# Patient Record
Sex: Male | Born: 1955 | Race: White | Hispanic: No | Marital: Single | Smoking: Current some day smoker
Health system: Southern US, Community
[De-identification: ages and names within clinical notes are randomized; demographics above are authoritative.]

## PROBLEM LIST (undated history)

## (undated) DIAGNOSIS — I2699 Other pulmonary embolism without acute cor pulmonale: Secondary | ICD-10-CM

## (undated) DIAGNOSIS — I8290 Acute embolism and thrombosis of unspecified vein: Secondary | ICD-10-CM

## (undated) HISTORY — DX: Acute embolism and thrombosis of unspecified vein: I82.90

## (undated) HISTORY — PX: FRACTURE SURGERY: SHX138

## (undated) HISTORY — DX: Other pulmonary embolism without acute cor pulmonale: I26.99

## (undated) HISTORY — PX: OTHER SURGICAL HISTORY: SHX169

## (undated) HISTORY — PX: TONSILLECTOMY: SUR1361

---

## 2006-01-14 ENCOUNTER — Other Ambulatory Visit: Payer: Self-pay

## 2006-01-14 ENCOUNTER — Emergency Department: Payer: Self-pay | Admitting: Emergency Medicine

## 2014-01-01 DIAGNOSIS — Z720 Tobacco use: Secondary | ICD-10-CM | POA: Insufficient documentation

## 2014-01-09 DIAGNOSIS — Z9889 Other specified postprocedural states: Secondary | ICD-10-CM | POA: Insufficient documentation

## 2014-01-09 DIAGNOSIS — Z789 Other specified health status: Secondary | ICD-10-CM | POA: Insufficient documentation

## 2014-01-09 DIAGNOSIS — Z7289 Other problems related to lifestyle: Secondary | ICD-10-CM | POA: Insufficient documentation

## 2014-01-09 DIAGNOSIS — I7410 Embolism and thrombosis of unspecified parts of aorta: Secondary | ICD-10-CM | POA: Insufficient documentation

## 2014-06-11 ENCOUNTER — Emergency Department: Payer: Self-pay | Admitting: Emergency Medicine

## 2014-12-13 ENCOUNTER — Ambulatory Visit (INDEPENDENT_AMBULATORY_CARE_PROVIDER_SITE_OTHER): Payer: BLUE CROSS/BLUE SHIELD | Admitting: Family Medicine

## 2014-12-13 ENCOUNTER — Other Ambulatory Visit: Payer: Self-pay | Admitting: Family Medicine

## 2014-12-13 ENCOUNTER — Telehealth: Payer: Self-pay

## 2014-12-13 ENCOUNTER — Encounter: Payer: Self-pay | Admitting: Family Medicine

## 2014-12-13 VITALS — BP 130/88 | HR 92 | Temp 98.2°F | Ht 72.7 in | Wt 122.0 lb

## 2014-12-13 DIAGNOSIS — K625 Hemorrhage of anus and rectum: Secondary | ICD-10-CM

## 2014-12-13 DIAGNOSIS — I2699 Other pulmonary embolism without acute cor pulmonale: Secondary | ICD-10-CM | POA: Diagnosis not present

## 2014-12-13 DIAGNOSIS — I82409 Acute embolism and thrombosis of unspecified deep veins of unspecified lower extremity: Secondary | ICD-10-CM

## 2014-12-13 DIAGNOSIS — L853 Xerosis cutis: Secondary | ICD-10-CM

## 2014-12-13 DIAGNOSIS — L850 Acquired ichthyosis: Secondary | ICD-10-CM

## 2014-12-13 MED ORDER — RIVAROXABAN 20 MG PO TABS: 20.0000 mg | ORAL_TABLET | Freq: Every day | ORAL | Status: DC

## 2014-12-13 NOTE — Telephone Encounter (Signed)
Patient referred to [facility] for a colonoscopy for rectal bleeding.  He is scheduled to be seen 12/18/2014 at 11am.  Patient given appointment information in the office.

## 2014-12-13 NOTE — Progress Notes (Signed)
Date of Service: 12/13/2014  Date of Birth May 14, 1956  Patient Name: Steve Chaney Provider: Olevia PerchesMegan Johnson, DO   Vital Signs: Blood pressure 130/88, pulse 92, temperature 98.2 F (36.8 C), height 6' 0.7" (1.847 m), weight 122 lb (55.339 kg), SpO2 97 %. BMI: Body mass index is 16.22 kg/(m^2).  Subjective:    Continues to be caring for his elderly aunt. Doing well. Presents today for follow up on a couple of things.   ABDOMINAL ISSUES- BRBPR in February when he first came in. Had had colonoscopy the year previously that was normal. He thinks that this was due to his hemorrhoids, and now he is doing better and has not had any issues since then.  Duration: 3 days, has not had had any problems since his last visit.  Constipation: intermittent Diarrhea: no Episodes of diarrhea/day: Mucous in the stool: no Heartburn: no Bloating:no Flatulence: no Nausea: no Vomiting: no Melena or hematochezia: no Rash: no Jaundice: no Fever: no Weight loss: no  He also notes that his fingers continue to be very sore and cracked and painful. These were pulled by a fishing line several years ago and they had to be debrided. They were very concerned about infection from the ocean, but it seems to have healed. However, now he notes that he has had a lot of pain and that the skin keeps cracking. He would like to see what he can do to try to get the skin to heal up.   He also needs a refill of his xarelto. He has been on it for about 18 months. 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 Notes have been difficult to track down on this occurrence, and it is not clear if he had just DVTs or arterial clots. He has been on xarelto ever since and has done well with it except for 1 episode of rectal bleeding, which has since resolved. He would like to come off of it if possible, but doesn't know if it is or not.   Review of Systems - Negative except what is listed above.    General ROS: negative Psychological ROS: negative Hematological and Lymphatic ROS: negative Musculoskeletal ROS: negative Dermatological ROS: negative    The patient does not have a history of falls. I did not complete a risk assessment for falls. A plan of care for falls was not documented.  Social History:  History   Social History  . Marital Status: Single    Spouse Name: N/A  . Number of Children: N/A  . Years of Education: N/A   Occupational History  . Not on file.   Social History Main Topics  . Smoking status: Current Every Day Smoker -- 0.50 packs/day    Types: Cigarettes  . Smokeless tobacco: Never Used  . Alcohol Use: 1.2 oz/week    2 Cans of beer per week     Comment: Drinks socially  . Drug Use: No  . Sexual Activity: Not on file   Other Topics Concern  . Not on file   Social History Narrative   History  Smoking status  . Current Every Day Smoker -- 0.50 packs/day  . Types: Cigarettes  Smokeless tobacco  . Never Used   History  Alcohol Use  . 1.2 oz/week  . 2 Cans of beer per week    Comment: Drinks socially    Family History  Problem Relation Age of Onset  . Hyperlipidemia Mother   .  Hyperlipidemia Father   . Heart disease Father    Past Medical History  Diagnosis Date  . Blood clot in vein    Past Surgical History  Procedure Laterality Date  . Tonsillectomy    . Blood clot       Current Outpatient Prescriptions on File Prior to Visit  Medication Sig Dispense Refill  . rivaroxaban (XARELTO) 20 MG TABS tablet Take 20 mg by mouth daily with supper.     No current facility-administered medications on file prior to visit.    No Known Allergies   Objective:  Physical Exam  Constitutional: He is oriented to person, place, and time. He appears well-developed and well-nourished.  HENT:  Head: Normocephalic and atraumatic.  Eyes: EOM are normal. Pupils are equal, round, and reactive to light.  Neck: Normal range of motion.   Cardiovascular: Normal rate, regular rhythm and normal heart sounds.   Pulmonary/Chest: Effort normal and breath sounds normal.  Neurological: He is alert and oriented to person, place, and time.  Skin: Skin is warm and dry.  Cracked dry skin at the tips of the index and middle fingers on the L  Psychiatric: He has a normal mood and affect. Judgment and thought content normal.     Assessment/Plan: 1. DVT (deep venous thrombosis), unspecified laterality Unclear if DVT or arterial clots, records from this hospitalization were requested, but have not been received. Patient would like to come off his xarelto, but unclear if he should. Referral to hematology made today for evaluation and determination regarding length of treatment. Will continue xarelto until that time.  - Ambulatory referral to Hematology  2. Pulmonary embolism Unclear if DVT or arterial clots, records from this hospitalization were requested, but have not been received. Patient would like to come off his xarelto, but unclear if he should. Referral to hematology made today for evaluation and determination regarding length of treatment. Will continue xarelto until that time.  - Ambulatory referral to Hematology  3. Dry skin dermatitis Does not appear to be infected, seems to be due to very dry skin. Information regarding hand dermatitis given to patient today as discussed below. Patient continues to have pain and has been having problems with this for >2 years. Will refer to dermatology for further evaluation. Call with any other concerns.  - Ambulatory referral to Dermatology  4. Bright red blood per rectum Has resolved with no issues. Had colonoscopy done about 18 months ago. Appointment with GI cancelled.Will cal if having further issues.     Follow-up: Return in about 3 months (around 03/15/2015) for PE.

## 2014-12-13 NOTE — Patient Instructions (Signed)
Rivaroxaban oral tablets What is this medicine? RIVAROXABAN (ri va ROX a ban) is an anticoagulant (blood thinner). It is used to treat blood clots in the lungs or in the veins. It is also used after knee or hip surgeries to prevent blood clots. It is also used to lower the chance of stroke in people with a medical condition called atrial fibrillation. This medicine may be used for other purposes; ask your health care provider or pharmacist if you have questions. COMMON BRAND NAME(S): Xarelto, Xarelto Starter Pack What should I tell my health care provider before I take this medicine? They need to know if you have any of these conditions: -bleeding disorders -bleeding in the brain -blood in your stools (black or tarry stools) or if you have blood in your vomit -history of stomach bleeding -kidney disease -liver disease -low blood counts, like low white cell, platelet, or red cell counts -recent or planned spinal or epidural procedure -take medicines that treat or prevent blood clots -an unusual or allergic reaction to rivaroxaban, other medicines, foods, dyes, or preservatives -pregnant or trying to get pregnant -breast-feeding How should I use this medicine? Take this medicine by mouth with a glass of water. Follow the directions on the prescription label. Take your medicine at regular intervals. Do not take it more often than directed. Do not stop taking except on your doctor's advice. Stopping this medicine may increase your risk of a blot clot. Be sure to refill your prescription before you run out of medicine. If you are taking this medicine after hip or knee replacement surgery, take it with or without food. If you are taking this medicine for atrial fibrillation, take it with your evening meal. If you are taking this medicine to treat blood clots, take it with food at the same time each day. If you are unable to swallow your tablet, you may crush the tablet and mix it in applesauce. Then,  immediately eat the applesauce. You should eat more food right after you eat the applesauce containing the crushed tablet. Talk to your pediatrician regarding the use of this medicine in children. Special care may be needed. Overdosage: If you think you have taken too much of this medicine contact a poison control center or emergency room at once. NOTE: This medicine is only for you. Do not share this medicine with others. What if I miss a dose? If you take your medicine once a day and miss a dose, take the missed dose as soon as you remember. If you take your medicine twice a day and miss a dose, take the missed dose immediately. In this instance, 2 tablets may be taken at the same time. The next day you should take 1 tablet twice a day as directed. What may interact with this medicine? -aspirin and aspirin-like medicines -certain antibiotics like erythromycin, azithromycin, and clarithromycin -certain medicines for fungal infections like ketoconazole and itraconazole -certain medicines for irregular heart beat like amiodarone, quinidine, dronedarone -certain medicines for seizures like carbamazepine, phenytoin -certain medicines that treat or prevent blood clots like warfarin, enoxaparin, and dalteparin -conivaptan -diltiazem -felodipine -indinavir -lopinavir; ritonavir -NSAIDS, medicines for pain and inflammation, like ibuprofen or naproxen -ranolazine -rifampin -ritonavir -St. John's wort -verapamil This list may not describe all possible interactions. Give your health care provider a list of all the medicines, herbs, non-prescription drugs, or dietary supplements you use. Also tell them if you smoke, drink alcohol, or use illegal drugs. Some items may interact with your medicine. What  should I watch for while using this medicine? Visit your doctor or health care professional for regular checks on your progress. Your condition will be monitored carefully while you are receiving this  medicine. Notify your doctor or health care professional and seek emergency treatment if you develop breathing problems; changes in vision; chest pain; severe, sudden headache; pain, swelling, warmth in the leg; trouble speaking; sudden numbness or weakness of the face, arm, or leg. These can be signs that your condition has gotten worse. If you are going to have surgery, tell your doctor or health care professional that you are taking this medicine. Tell your health care professional that you use this medicine before you have a spinal or epidural procedure. Sometimes people who take this medicine have bleeding problems around the spine when they have a spinal or epidural procedure. This bleeding is very rare. If you have a spinal or epidural procedure while on this medicine, call your health care professional immediately if you have back pain, numbness or tingling (especially in your legs and feet), muscle weakness, paralysis, or loss of bladder or bowel control. Avoid sports and activities that might cause injury while you are using this medicine. Severe falls or injuries can cause unseen bleeding. Be careful when using sharp tools or knives. Consider using an Neurosurgeonelectric razor. Take special care brushing or flossing your teeth. Report any injuries, bruising, or red spots on the skin to your doctor or health care professional. What side effects may I notice from receiving this medicine? Side effects that you should report to your doctor or health care professional as soon as possible: -allergic reactions like skin rash, itching or hives, swelling of the face, lips, or tongue -back pain -redness, blistering, peeling or loosening of the skin, including inside the mouth -signs and symptoms of bleeding such as bloody or black, tarry stools; red or dark-brown urine; spitting up blood or brown material that looks like coffee grounds; red spots on the skin; unusual bruising or bleeding from the eye, gums, or  nose Side effects that usually do not require medical attention (Report these to your doctor or health care professional if they continue or are bothersome.): -dizziness -muscle pain This list may not describe all possible side effects. Call your doctor for medical advice about side effects. You may report side effects to FDA at 1-800-FDA-1088. Where should I keep my medicine? Keep out of the reach of children. Store at room temperature between 15 and 30 degrees C (59 and 86 degrees F). Throw away any unused medicine after the expiration date. NOTE: This sheet is a summary. It may not cover all possible information. If you have questions about this medicine, talk to your doctor, pharmacist, or health care provider.  2015, Elsevier/Gold Standard. (2013-10-19 18:47:48) Hand Dermatitis Hand dermatitis (dyshidrotic eczema) is a skin condition in which small, itchy, raised dots or fluid-filled blisters form over the palms of the hands. Outbreaks of hand dermatitis can last 3 to 4 weeks. CAUSES  The cause of hand dermatitis is unknown. However, it occurs most often in patients with a history of allergies such as:  Hay fever.  Allergic asthma.  Allergies to latex. Chemical exposure, injuries, and environmental irritants can make hand dermatitis worse. Washing your hands too frequently can remove natural oils, which can dry out the skin and contribute to outbreaks of hand dermatitis. SYMPTOMS  The most common symptom of hand dermatitis is intense itching. Cracks or grooves (fissures) on the fingers can also develop.  Affected areas can be painful, especially areas where large blisters have formed. DIAGNOSIS Your caregiver can usually tell what the problem is by doing a physical exam. PREVENTION  Avoid excessive hand washing.  Avoid the use of harsh chemicals.  Wear protective gloves when handling products that can irritate your skin. TREATMENT  Steroid creams and ointments, such as  over-the-counter 1% hydrocortisone cream, can reduce inflammation and improve moisture retention. These should be applied at least 2 to 4 times per day. Your caregiver may ask you to use a stronger prescription steroid cream to help speed the healing of blistered and cracked skin. In severe cases, oral steroid medicine may be needed. If you have an infection, antibiotics may be needed. Your caregiver may also prescribe antihistamines. These medicines help reduce itching. HOME CARE INSTRUCTIONS  Only take over-the-counter or prescription medicines as directed by your caregiver.  You may use wet or cold compresses. This can help:  Alleviate itching.  Increase the effectiveness of topical creams.  Minimize blisters. SEEK MEDICAL CARE IF:  The rash is not better after 1 week of treatment.  Signs of infection develop, such as redness, tenderness, or yellowish-white fluid (pus).  The rash is spreading. Document Released: 06/29/2005 Document Revised: 09/21/2011 Document Reviewed: 11/26/2010 [facility] Patient Information 2015 [place], [place]. This information is not intended to replace advice given to you by your health care provider. Make sure you discuss any questions you have with your health care provider.

## 2014-12-18 ENCOUNTER — Ambulatory Visit: Payer: BLUE CROSS/BLUE SHIELD | Admitting: Urgent Care

## 2014-12-24 ENCOUNTER — Inpatient Hospital Stay: Payer: Self-pay | Admitting: Hematology and Oncology

## 2015-01-04 ENCOUNTER — Ambulatory Visit: Payer: Self-pay | Admitting: Hematology and Oncology

## 2015-01-07 ENCOUNTER — Inpatient Hospital Stay: Payer: BLUE CROSS/BLUE SHIELD | Attending: Hematology and Oncology | Admitting: Hematology and Oncology

## 2015-01-24 ENCOUNTER — Inpatient Hospital Stay: Payer: BLUE CROSS/BLUE SHIELD

## 2015-01-24 ENCOUNTER — Encounter: Payer: Self-pay | Admitting: Hematology and Oncology

## 2015-01-24 ENCOUNTER — Inpatient Hospital Stay: Payer: BLUE CROSS/BLUE SHIELD | Attending: Hematology and Oncology | Admitting: Hematology and Oncology

## 2015-01-24 ENCOUNTER — Inpatient Hospital Stay: Payer: BLUE CROSS/BLUE SHIELD | Admitting: Hematology and Oncology

## 2015-01-24 VITALS — BP 133/75 | HR 82 | Temp 98.0°F | Resp 18 | Ht 74.0 in | Wt 122.9 lb

## 2015-01-24 DIAGNOSIS — Z86718 Personal history of other venous thrombosis and embolism: Secondary | ICD-10-CM | POA: Insufficient documentation

## 2015-01-24 DIAGNOSIS — I749 Embolism and thrombosis of unspecified artery: Secondary | ICD-10-CM

## 2015-01-24 DIAGNOSIS — Z7901 Long term (current) use of anticoagulants: Secondary | ICD-10-CM | POA: Insufficient documentation

## 2015-01-24 DIAGNOSIS — F17210 Nicotine dependence, cigarettes, uncomplicated: Secondary | ICD-10-CM | POA: Diagnosis not present

## 2015-01-31 ENCOUNTER — Telehealth: Payer: Self-pay

## 2015-02-05 ENCOUNTER — Ambulatory Visit: Payer: BLUE CROSS/BLUE SHIELD | Admitting: Hematology and Oncology

## 2015-02-07 ENCOUNTER — Inpatient Hospital Stay: Payer: BLUE CROSS/BLUE SHIELD | Admitting: Hematology and Oncology

## 2015-02-07 NOTE — Progress Notes (Signed)
[facility]  Clinic day:  01/24/2015  Chief Complaint: Steve Chaney is a 59 y.o. male with a history of pulmonary embolism who is referred by Dr. Dossie Arbour for evaluation and management.  HPI: The patient notes a syncopal episode on 04/03/2013.  He describes difficulty moving his legs and them feeling like rubber.  His legs were "blue" and "they almost had to cut my leg off".    He was seen admitted to [facility] in [place], [place].  He notes clots in both his legs (unknown arterial or venous).  He had bilateral lower extremity catheters placed to remove/dissolve the clots.  He states that he did not have a pulmonary embolism.  Extensive blood testing was done.  He was put on Xarelto.  He has been "ok" since.  Symptomatically, he states that he sometimes he gets stiff in his right calf with walking on the job.  He denies any problems except for an "infection under my fingernails from working [place]  His weight is stable.   He denies any B symptoms.  He had a colonoscopy in 2015.  PSA is "fine".    Past Medical History  Diagnosis Date  . Blood clot in vein   . Pulmonary embolism     Past Surgical History  Procedure Laterality Date  . Tonsillectomy    . Blood clot      Family History  Problem Relation Age of Onset  . Hyperlipidemia Mother   . Hyperlipidemia Father   . Heart disease Father   . Diabetes Cousin   His father has been on Coumadin for "awhile" for unclear reasons.  Social History:  reports that he has been smoking Cigarettes.  He has been smoking about 0.50 packs per day. He has never used smokeless tobacco. He reports that he drinks about 3.6 oz of alcohol per week. He reports that he does not use illicit drugs.  He owns a Personal assistant.  He is a former Occupational hygienist.  The patient is alone today.  Allergies: No Known Allergies  Current Medications: Current Outpatient Prescriptions  Medication  Sig Dispense Refill  . rivaroxaban (XARELTO) 20 MG TABS tablet Take 1 tablet (20 mg total) by mouth daily with supper. 30 tablet 2   No current facility-administered medications for this visit.    Review of Systems:  GENERAL:  Feels good.  Active.  No fevers, sweats or weight loss. PERFORMANCE STATUS (ECOG):  0 HEENT:  No visual changes, runny nose, sore throat, mouth sores or tenderness. Lungs: No shortness of breath or cough.  No hemoptysis. Cardiac:  No chest pain, palpitations, orthopnea, or PND. GI:  No nausea, vomiting, diarrhea, constipation, melena or hematochezia.  Colonoscopy in 2015. GU:  PSA is "fine".  No urgency, frequency, dysuria, or hematuria. Musculoskeletal:  No back pain.  No joint pain.  No muscle tenderness. Extremities:  No pain or swelling. Skin:  Infection under fingernails.  No rashes or skin changes. Neuro:  No headache, numbness or weakness, balance or coordination issues. Endocrine:  No diabetes, thyroid issues, hot flashes or night sweats. Psych:  No mood changes, depression or anxiety. Pain:  No focal pain. Review of systems:  All other systems reviewed and found to be negative.  Physical Exam: Blood pressure 133/75, pulse 82, temperature 98 F (36.7 C), temperature source Tympanic, resp. rate 18, height  (1.88 m), weight 122 lb 14.5 oz (55.75 kg). GENERAL:  Thin tan  gentleman sitting comfortably in the exam room in no acute distress. MENTAL STATUS:  Alert and oriented to person, place and time. HEAD:  Normocephalic, atraumatic, face symmetric, no Cushingoid features. EYES:  Pupils equal round and reactive to light and accomodation.  No conjunctivitis or scleral icterus. ENT:  Oropharynx clear without lesion.  Upper dentures.  Tongue normal. Mucous membranes moist.  RESPIRATORY:  Clear to auscultation without rales, wheezes or rhonchi. CARDIOVASCULAR:  Regular rate and rhythm without murmur, rub or gallop. ABDOMEN:  Soft, non-tender, with active  bowel sounds, and no hepatosplenomegaly.  No masses. SKIN:  No rashes, ulcers or lesions. EXTREMITIES: Well healed incisions in lower legs.  No edema, no skin discoloration or tenderness.  No palpable cords. LYMPH NODES: No palpable cervical, supraclavicular, axillary or inguinal adenopathy  NEUROLOGICAL: Unremarkable. PSYCH:  Appropriate.  No results found for any previous visit.  Assessment:  Steve Chaney is a 59 y.o. male with a syncopal episode associated with extensive bilateral lower extremity thrombosis on 04/03/2013 at [facility].  It is unclear if clots were arterial or venous.  By history, clots appears arterial.  He has undergone an extensive hypercoagulable work-up.  He has been on Xarelto since without recurrence of clot.  Symptomatically, he denies any problems.  He appears up-to-date on preventive screening (colonoscopy and PSA).  He smokes.  Exam is unremarkable.  Plan: 1. Labs today:  CBC with diff, lupus anticoagulant, anticardiolipin antibodies. 2. Release of information from [facility] in [place], [place] 3. Discuss likely need for long term anticoagulation. 4. Continue Xarelto. 5. Discuss smoking cessation. 6. RTC in 2 weeks for review of testing and outside records.   Rosey Bath, MD  01/24/2015

## 2015-03-11 ENCOUNTER — Inpatient Hospital Stay: Payer: BLUE CROSS/BLUE SHIELD | Attending: Hematology and Oncology | Admitting: Hematology and Oncology

## 2015-03-25 ENCOUNTER — Encounter: Payer: BLUE CROSS/BLUE SHIELD | Admitting: Family Medicine

## 2015-03-26 ENCOUNTER — Inpatient Hospital Stay: Payer: BLUE CROSS/BLUE SHIELD | Attending: Hematology and Oncology | Admitting: Hematology and Oncology

## 2015-04-29 ENCOUNTER — Ambulatory Visit: Payer: BLUE CROSS/BLUE SHIELD | Admitting: Family Medicine

## 2015-05-06 ENCOUNTER — Telehealth: Payer: Self-pay

## 2015-05-06 NOTE — Telephone Encounter (Signed)
Patient returned my call, he is having congestion, he wanted to know if something could be called in. I explained to him that we can see him sometime this week, but we will not call him in any medication. Patient was offered an appointment but due to his work schedule he declined, I suggested that he go to urgent care since their hours are more flexible with his work schedule.

## 2015-05-06 NOTE — Telephone Encounter (Signed)
Received a voicemail from patient, unable to understand message. Called patient back, no answer, left a voicemail for patient to call me back if he still needs me.

## 2015-05-13 ENCOUNTER — Ambulatory Visit (INDEPENDENT_AMBULATORY_CARE_PROVIDER_SITE_OTHER): Payer: BLUE CROSS/BLUE SHIELD | Admitting: Family Medicine

## 2015-05-13 ENCOUNTER — Encounter: Payer: Self-pay | Admitting: Family Medicine

## 2015-05-13 VITALS — BP 127/89 | HR 100 | Temp 98.8°F | Ht 71.1 in | Wt 120.0 lb

## 2015-05-13 DIAGNOSIS — R059 Cough, unspecified: Secondary | ICD-10-CM

## 2015-05-13 DIAGNOSIS — R05 Cough: Secondary | ICD-10-CM

## 2015-05-13 DIAGNOSIS — J449 Chronic obstructive pulmonary disease, unspecified: Secondary | ICD-10-CM | POA: Insufficient documentation

## 2015-05-13 DIAGNOSIS — J410 Simple chronic bronchitis: Secondary | ICD-10-CM

## 2015-05-13 DIAGNOSIS — K4090 Unilateral inguinal hernia, without obstruction or gangrene, not specified as recurrent: Secondary | ICD-10-CM | POA: Diagnosis not present

## 2015-05-13 MED ORDER — FLUTICASONE FUROATE-VILANTEROL 100-25 MCG/INH IN AEPB: 1.0000 | INHALATION_SPRAY | Freq: Every day | RESPIRATORY_TRACT | Status: DC

## 2015-05-13 MED ORDER — ALBUTEROL SULFATE HFA 108 (90 BASE) MCG/ACT IN AERS: 2.0000 | INHALATION_SPRAY | Freq: Four times a day (QID) | RESPIRATORY_TRACT | Status: DC | PRN

## 2015-05-13 NOTE — Assessment & Plan Note (Signed)
Discussed results of spiro. Will start him on breo and albuterol. Follow up 1 month for recheck on spiro.

## 2015-05-13 NOTE — Assessment & Plan Note (Signed)
Enlarged. Non-strangulated. Referral to general surgery made.

## 2015-05-13 NOTE — Patient Instructions (Signed)
Chronic Bronchitis Chronic bronchitis is a lasting inflammation of the bronchial tubes, which are the tubes that carry air into your lungs. This is inflammation that occurs:   On most days of the week.   For at least three months at a time.   Over a period of two years in a row. When the bronchial tubes are inflamed, they start to produce mucus. The inflammation and buildup of mucus make it more difficult to breathe. Chronic bronchitis is usually a permanent problem and is one type of chronic obstructive pulmonary disease (COPD). People with chronic bronchitis are at greater risk for getting repeated colds, or respiratory infections. CAUSES  Chronic bronchitis most often occurs in people who have:  Long-standing, severe asthma.  A history of smoking.  Asthma and who also smoke. SIGNS AND SYMPTOMS  Chronic bronchitis may cause the following:   A cough that brings up mucus (productive cough).  Shortness of breath.  Early morning headache.  Wheezing.  Chest discomfort.   Recurring respiratory infections. DIAGNOSIS  Your health care provider may confirm the diagnosis by:  Taking your medical history.  Performing a physical exam.  Taking a chest X-ray.   Performing pulmonary function tests. TREATMENT  Treatment involves controlling symptoms with medicines, oxygen therapy, or making lifestyle changes, such as exercising and eating a healthy, well-balanced diet. Medicines could include:  Inhalers to improve air flow in and out of your lungs.  Antibiotics to treat bacterial infections, such as pneumonia, sinus infections, and acute bronchitis. As a preventative measure, your health care provider may recommend routine vaccinations for influenza and pneumonia. This is to prevent infection and hospitalization since you may be more at risk for these types of infections.  HOME CARE INSTRUCTIONS  Take medicines only as directed by your health care provider.   If you smoke  cigarettes, chew tobacco, or use electronic cigarettes, quit. If you need help quitting, ask your health care provider.  Avoid pollen, dust, animal dander, molds, smoke, and other things that cause shortness of breath or wheezing attacks.  Talk to your health care provider about possible exercise routines. Regular exercise is very important to help you feel better.  If you are prescribed oxygen use at home follow these guidelines:  Never smoke while using oxygen. Oxygen does not burn or explode, but flammable materials will burn faster in the presence of oxygen.  Keep a fire extinguisher close by. Let your fire department know that you have oxygen in your home.  Warn visitors not to smoke near you when you are using oxygen. Put up "no smoking" signs in your home where you most often use the oxygen.  Regularly test your smoke detectors at home to make sure they work. If you receive care in your home from a nurse or other health care provider, he or she may also check to make sure your smoke detectors work.  Ask your health care provider whether you would benefit from a pulmonary rehabilitation program.  Do not wait to get medical care if you have any concerning symptoms. Delays could cause permanent injury and may be life threatening. SEEK MEDICAL CARE IF:  You have increased coughing or shortness of breath or both.  You have muscle aches.  You have chest pain.  Your mucus gets thicker.  Your mucus changes from clear or white to yellow, green, gray, or bloody. SEEK IMMEDIATE MEDICAL CARE IF:  Your usual medicines do not stop your wheezing.   You have increased difficulty breathing.     You have any problems with the medicine you are taking, such as a rash, itching, swelling, or trouble breathing. MAKE SURE YOU:   Understand these instructions.  Will watch your condition.  Will get help right away if you are not doing well or get worse.   This information is not intended to  replace advice given to you by your health care provider. Make sure you discuss any questions you have with your health care provider.   Document Released: 04/16/2006 Document Revised: 07/20/2014 Document Reviewed: 08/07/2013 Elsevier Interactive Patient Education 2016 [employer] Inc.  

## 2015-05-13 NOTE — Progress Notes (Signed)
BP 127/89 mmHg  Pulse 100  Temp(Src) 98.8 F (37.1 C)  Ht 5' 11.1" (1.806 m)  Wt 120 lb (54.432 kg)  BMI 16.69 kg/m2  SpO2 98%   Subjective:    Patient ID: Steve Chaney, male    DOB: [birth year], 59 y.o.   MRN: [id number]  HPI: Steve SalonCharles Chaney is a 59 y.o. male  Chief Complaint  Patient presents with  . Hernia    X 4 months  . chest congestion    patient states that it is severe at night    UPPER RESPIRATORY TRACT INFECTION Worst symptom: coughing Fever: no Cough: yes- clear stuff productive, mainly at night Shortness of breath: no Wheezing: yes Chest pain: no Chest tightness: no Chest congestion: yes Nasal congestion: no Runny nose: no Post nasal drip: no Sneezing: no Sore throat: no Swollen glands: no Sinus pressure: no Headache: no Face pain: no Toothache: no Ear pain: no  Ear pressure: no  Eyes red/itching:no Eye drainage/crusting: no  Vomiting: no Rash: no Fatigue: no Sick contacts: no Strep contacts: no  Context: stable Recurrent sinusitis: no  HERNIA Duration: about 4 years Location: groin Painful: no Discomfort: yes Bulge: yes Quality: tight Severity: moderate Onset: gradual Context: getting bigger Aggravating factors: none  Relevant past medical, surgical, family and social history reviewed and updated as indicated. Interim medical history since our last visit reviewed. Allergies and medications reviewed and updated.  Review of Systems  Constitutional: Negative.   Respiratory: Negative.   Cardiovascular: Negative.   Genitourinary: Negative.   Psychiatric/Behavioral: Negative.     Per HPI unless specifically indicated above     Objective:    BP 127/89 mmHg  Pulse 100  Temp(Src) 98.8 F (37.1 C)  Ht 5' 11.1" (1.806 m)  Wt 120 lb (54.432 kg)  BMI 16.69 kg/m2  SpO2 98%  Wt Readings from Last 3 Encounters:  05/13/15 120 lb (54.432 kg)  01/24/15 122 lb 14.5 oz (55.75 kg)  12/13/14 122 lb (55.339 kg)    Physical Exam   Constitutional: He is oriented to person, place, and time. He appears well-developed and well-nourished. No distress.  HENT:  Head: Normocephalic and atraumatic.  Right Ear: Hearing normal.  Left Ear: Hearing normal.  Nose: Nose normal.  Eyes: Conjunctivae and lids are normal. Right eye exhibits no discharge. Left eye exhibits no discharge. No scleral icterus.  Cardiovascular: Normal rate, regular rhythm, normal heart sounds and intact distal pulses.  Exam reveals no gallop and no friction rub.   No murmur heard. Pulmonary/Chest: Effort normal. No respiratory distress. He has no wheezes. He has no rales. He exhibits no tenderness.  Abdominal: A hernia is present. Hernia confirmed positive in the right inguinal area. Hernia confirmed negative in the left inguinal area.  Genitourinary: Testes normal and penis normal.  Musculoskeletal: Normal range of motion.  Lymphadenopathy:       Right: No inguinal adenopathy present.       Left: No inguinal adenopathy present.  Neurological: He is alert and oriented to person, place, and time.  Skin: Skin is warm, dry and intact. No rash noted. No erythema. No pallor.  Psychiatric: He has a normal mood and affect. His speech is normal and behavior is normal. Judgment and thought content normal. Cognition and memory are normal.  Nursing note and vitals reviewed.   No results found for this or any previous visit.    Assessment & Plan:   Problem List Items Addressed This Visit      Respiratory  COPD (chronic obstructive pulmonary disease) (HCC)    Discussed results of spiro. Will start him on breo and albuterol. Follow up 1 month for recheck on spiro.       Relevant Medications   albuterol (PROVENTIL HFA;VENTOLIN HFA) 108 (90 BASE) MCG/ACT inhaler   Fluticasone Furoate-Vilanterol (BREO ELLIPTA) 100-25 MCG/INH AEPB     Other   Inguinal hernia unilateral, non-recurrent - Primary    Enlarged. Non-strangulated. Referral to general surgery made.        Relevant Orders   Ambulatory referral to General Surgery    Other Visit Diagnoses    Cough        Lungs clear. Will check spiro.     Relevant Orders    Spirometry with graph (Completed)        Follow up plan: Return in about 4 weeks (around 06/10/2015) for follow up breathing.

## 2015-06-13 ENCOUNTER — Ambulatory Visit: Payer: BLUE CROSS/BLUE SHIELD | Admitting: Family Medicine

## 2015-06-25 ENCOUNTER — Ambulatory Visit: Payer: BLUE CROSS/BLUE SHIELD | Admitting: Family Medicine

## 2015-07-09 ENCOUNTER — Ambulatory Visit: Payer: BLUE CROSS/BLUE SHIELD | Admitting: Family Medicine

## 2015-07-22 ENCOUNTER — Ambulatory Visit: Payer: BLUE CROSS/BLUE SHIELD | Admitting: Family Medicine

## 2015-08-05 ENCOUNTER — Encounter: Payer: Self-pay | Admitting: Family Medicine

## 2015-08-05 ENCOUNTER — Ambulatory Visit (INDEPENDENT_AMBULATORY_CARE_PROVIDER_SITE_OTHER): Payer: BLUE CROSS/BLUE SHIELD | Admitting: Family Medicine

## 2015-08-05 VITALS — BP 143/92 | HR 93 | Temp 98.8°F | Ht 72.7 in | Wt 130.0 lb

## 2015-08-05 DIAGNOSIS — F329 Major depressive disorder, single episode, unspecified: Secondary | ICD-10-CM

## 2015-08-05 DIAGNOSIS — F32A Depression, unspecified: Secondary | ICD-10-CM | POA: Insufficient documentation

## 2015-08-05 MED ORDER — FLUOXETINE HCL 20 MG PO CAPS: ORAL_CAPSULE | ORAL | Status: DC

## 2015-08-05 NOTE — Progress Notes (Signed)
BP 143/92 mmHg  Pulse 93  Temp(Src) 98.8 F (37.1 C)  Ht 6' 0.7" (1.847 m)  Wt 130 lb (58.968 kg)  BMI 17.29 kg/m2  SpO2 99%   Subjective:    Patient ID: Steve Chaney, male    DOB: [birth year], 60 y.o.   MRN: [id number]  HPI: Steve Chaney is a 60 y.o. male  Chief Complaint  Patient presents with  . referal for psychiatry  . wants to discuss coming off xerelto   DEPRESSION- has had a few deaths in the family and with friends. Has been terrible and he feels completely out of control.  Mood status: uncontrolled Satisfied with current treatment?: no Symptom severity: severe  Duration of current treatment : Not on anything, has never been on anything Psychotherapy/counseling: no, talking to pastor and grief counsellor Previous psychiatric medications: none Depressed mood: yes Anxious mood: no Anhedonia: no Significant weight loss or gain: no Insomnia: no  Fatigue: no Feelings of worthlessness or guilt: yes Impaired concentration/indecisiveness: no Suicidal ideations: no Hopelessness: no Crying spells: yes  Relevant past medical, surgical, family and social history reviewed and updated as indicated. Interim medical history since our last visit reviewed. Allergies and medications reviewed and updated.  Review of Systems  Constitutional: Negative.   Respiratory: Negative.   Cardiovascular: Negative.   Musculoskeletal: Negative.   Psychiatric/Behavioral: Positive for dysphoric mood. Negative for suicidal ideas, hallucinations, behavioral problems, confusion, sleep disturbance, self-injury, decreased concentration and agitation. The patient is nervous/anxious. The patient is not hyperactive.     Per HPI unless specifically indicated above     Objective:    BP 143/92 mmHg  Pulse 93  Temp(Src) 98.8 F (37.1 C)  Ht 6' 0.7" (1.847 m)  Wt 130 lb (58.968 kg)  BMI 17.29 kg/m2  SpO2 99%  Wt Readings from Last 3 Encounters:  08/05/15 130 lb (58.968 kg)  05/13/15 120  lb (54.432 kg)  01/24/15 122 lb 14.5 oz (55.75 kg)    Physical Exam  Constitutional: He is oriented to person, place, and time. He appears well-developed and well-nourished. No distress.  HENT:  Head: Normocephalic and atraumatic.  Right Ear: Hearing and external ear normal.  Left Ear: Hearing and external ear normal.  Nose: Nose normal.  Mouth/Throat: No oropharyngeal exudate.  Eyes: Conjunctivae, EOM and lids are normal. Pupils are equal, round, and reactive to light. Right eye exhibits no discharge. Left eye exhibits no discharge. No scleral icterus.  Pulmonary/Chest: Effort normal. No respiratory distress.  Musculoskeletal: Normal range of motion.  Neurological: He is alert and oriented to person, place, and time.  Skin: Skin is warm, dry and intact. No rash noted. He is not diaphoretic. No erythema. No pallor.  Psychiatric: His speech is normal and behavior is normal. Judgment and thought content normal. Cognition and memory are normal. He exhibits a depressed mood.  Crying   Nursing note and vitals reviewed.   No results found for this or any previous visit.    Assessment & Plan:   Problem List Items Addressed This Visit      Other   Depression - Primary    Recurrent and severe. Will start him on fluoxetine today. Will see counselor. Will continue to follow with grief counselor. Recheck in 2 weeks. Continue to monitor closely.      Relevant Medications   FLUoxetine (PROZAC) 20 MG capsule       Follow up plan: Return in about 2 weeks (around 08/19/2015) for Follow up depression.

## 2015-08-05 NOTE — Assessment & Plan Note (Signed)
Recurrent and severe. Will start him on fluoxetine today. Will see counselor. Will continue to follow with grief counselor. Recheck in 2 weeks. Continue to monitor closely.

## 2015-08-06 ENCOUNTER — Telehealth: Payer: Self-pay

## 2015-08-06 MED ORDER — VENLAFAXINE HCL 37.5 MG PO TABS: 37.5000 mg | ORAL_TABLET | Freq: Two times a day (BID) | ORAL | Status: DC

## 2015-08-06 NOTE — Telephone Encounter (Signed)
That's fine. New Rx sent to his pharmacy.

## 2015-08-06 NOTE — Telephone Encounter (Signed)
Patient states that he is having anxiety when he thinks about taking Prozac.  Patient states after speaking with his cousin (she is a Engineer, civil (consulting)) and speaking with his boss (who used to take Prozac) that he feels that Effexor may be a better solution and cause less side effects.  Please call patient to advise if RX can be changed.  Patient's preferred pharmacy is: [facility] located at [address] (near [address]).

## 2015-08-19 ENCOUNTER — Ambulatory Visit: Payer: BLUE CROSS/BLUE SHIELD | Admitting: Family Medicine

## 2015-08-23 ENCOUNTER — Encounter: Payer: Self-pay | Admitting: Hematology and Oncology

## 2015-08-29 ENCOUNTER — Ambulatory Visit: Payer: BLUE CROSS/BLUE SHIELD | Admitting: Family Medicine

## 2015-09-12 ENCOUNTER — Ambulatory Visit: Payer: BLUE CROSS/BLUE SHIELD | Admitting: Family Medicine

## 2015-10-03 ENCOUNTER — Ambulatory Visit: Payer: BLUE CROSS/BLUE SHIELD | Admitting: Family Medicine

## 2015-10-17 ENCOUNTER — Ambulatory Visit: Payer: BLUE CROSS/BLUE SHIELD | Admitting: Family Medicine

## 2015-11-24 ENCOUNTER — Other Ambulatory Visit: Payer: Self-pay | Admitting: Family Medicine

## 2015-12-12 ENCOUNTER — Other Ambulatory Visit: Payer: Self-pay | Admitting: Family Medicine

## 2015-12-16 NOTE — Telephone Encounter (Signed)
Johnson pt

## 2015-12-16 NOTE — Telephone Encounter (Signed)
Apt mj 

## 2015-12-19 ENCOUNTER — Other Ambulatory Visit: Payer: Self-pay | Admitting: Family Medicine

## 2015-12-20 NOTE — Telephone Encounter (Signed)
Your patient 

## 2015-12-30 NOTE — Telephone Encounter (Signed)
Called several times, no answer. Left messages to give us a call back.

## 2016-03-13 ENCOUNTER — Encounter: Payer: Self-pay | Admitting: *Deleted

## 2016-03-13 ENCOUNTER — Emergency Department: Payer: 59

## 2016-03-13 ENCOUNTER — Emergency Department
Admission: EM | Admit: 2016-03-13 | Discharge: 2016-03-13 | Disposition: A | Discharge status: Home / Self Care | Payer: 59 | Attending: Emergency Medicine | Admitting: Emergency Medicine

## 2016-03-13 DIAGNOSIS — Z79899 Other long term (current) drug therapy: Secondary | ICD-10-CM | POA: Insufficient documentation

## 2016-03-13 DIAGNOSIS — J449 Chronic obstructive pulmonary disease, unspecified: Secondary | ICD-10-CM | POA: Insufficient documentation

## 2016-03-13 DIAGNOSIS — L03116 Cellulitis of left lower limb: Secondary | ICD-10-CM

## 2016-03-13 DIAGNOSIS — F17210 Nicotine dependence, cigarettes, uncomplicated: Secondary | ICD-10-CM | POA: Insufficient documentation

## 2016-03-13 DIAGNOSIS — A46 Erysipelas: Secondary | ICD-10-CM | POA: Diagnosis not present

## 2016-03-13 DIAGNOSIS — M129 Arthropathy, unspecified: Secondary | ICD-10-CM | POA: Diagnosis present

## 2016-03-13 LAB — CBC WITH DIFFERENTIAL/PLATELET
BASOS PCT: 1 %
Basophils Absolute: 0.1 10*3/uL (ref 0–0.1)
Eosinophils Absolute: 0.1 10*3/uL (ref 0–0.7)
Eosinophils Relative: 1 %
HEMATOCRIT: 51.6 % (ref 40.0–52.0)
Hemoglobin: 17.4 g/dL (ref 13.0–18.0)
Lymphocytes Relative: 11 %
Lymphs Abs: 1.2 10*3/uL (ref 1.0–3.6)
MCH: 30.1 pg (ref 26.0–34.0)
MCHC: 33.7 g/dL (ref 32.0–36.0)
MCV: 89.2 fL (ref 80.0–100.0)
MONO ABS: 1.4 10*3/uL — AB (ref 0.2–1.0)
MONOS PCT: 13 %
NEUTROS ABS: 8.1 10*3/uL — AB (ref 1.4–6.5)
Neutrophils Relative %: 74 %
Platelets: 430 10*3/uL (ref 150–440)
RBC: 5.78 MIL/uL (ref 4.40–5.90)
RDW: 19.6 % — AB (ref 11.5–14.5)
WBC: 10.7 10*3/uL — ABNORMAL HIGH (ref 3.8–10.6)

## 2016-03-13 LAB — BASIC METABOLIC PANEL
ANION GAP: 8 (ref 5–15)
BUN: 10 mg/dL (ref 6–20)
CALCIUM: 8.9 mg/dL (ref 8.9–10.3)
CO2: 26 mmol/L (ref 22–32)
CREATININE: 0.69 mg/dL (ref 0.61–1.24)
Chloride: 101 mmol/L (ref 101–111)
GFR calc Af Amer: >60 mL/min (ref >60)
GFR calc non Af Amer: >60 mL/min (ref >60)
GLUCOSE: 87 mg/dL (ref 65–99)
Potassium: 3.7 mmol/L (ref 3.5–5.1)
Sodium: 135 mmol/L (ref 135–145)

## 2016-03-13 MED ORDER — CLINDAMYCIN PHOSPHATE 600 MG/50ML IV SOLN
600.0000 mg | Freq: Once | INTRAVENOUS | Status: AC
  Administered 2016-03-13: 600 mg via INTRAVENOUS
  Filled 2016-03-13: qty 50

## 2016-03-13 MED ORDER — CLINDAMYCIN HCL 300 MG PO CAPS: 300.0000 mg | ORAL_CAPSULE | Freq: Four times a day (QID) | ORAL | 0 refills | Status: AC

## 2016-03-13 NOTE — ED Triage Notes (Signed)
Patient c/o left knee pain that began last night. Patient is on Xarelto for a history of blood clots. Left knee is swollen in triage and has redness. Patient states swelling went down with heat last night, but is present this a.m. Patient drove himself here.

## 2016-03-13 NOTE — ED Notes (Signed)
Pt taken to [facility]

## 2016-03-13 NOTE — Discharge Instructions (Signed)
Take the antibiotic as directed. Rest with the leg elevated when seated. Return to the ED immediately for signs of worsening infection.

## 2016-03-13 NOTE — ED Provider Notes (Signed)
[facility] Emergency Department Provider Note ____________________________________________  Time seen: [id number]  I have reviewed the triage vital signs and the nursing notes.  HISTORY  Chief Complaint  Joint Swelling  HPI Steve Chaney is a 60 y.o. male presents to the ED for evaluation of some left knee redness, swelling, and tenderness over the last15 hours. The patient denies any recent injury, accident, or trauma. He does admit to being previously on some red toe for lower extremities blood clots and had discontinued the Xarelto about 3 months prior. He apparently had some intermittent leg pain and was advised to restart the Xarelto about 10 days prior. He denies sudden onset of left knee pain, redness, and swelling last night. He admits to applying heat to the knee overnight and presents today with some slightly decreased swelling. He denies any interim fevers, chills, sweats. Also denies any distal paresthesias, shortness of breath, or chest pain.  Past Medical History:  Diagnosis Date  . Blood clot in vein   . Pulmonary embolism [facility](HCC)     Patient Active Problem List   Diagnosis Date Noted  . Depression 08/05/2015  . Inguinal hernia unilateral, non-recurrent 05/13/2015  . COPD (chronic obstructive pulmonary disease) (HCC) 05/13/2015  . 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 blood clot    . TONSILLECTOMY      Prior to Admission medications   Medication Sig Start Date End Date Taking? Authorizing Provider  albuterol (PROVENTIL HFA;VENTOLIN HFA) 108 (90 BASE) MCG/ACT inhaler Inhale 2 puffs into the lungs every 6 (six) hours as needed for wheezing or shortness of breath. 05/13/15   Megan P Johnson, DO  clindamycin (CLEOCIN) 300 MG capsule Take 1 capsule (300 mg total) by mouth 4 (four) times daily. 03/13/16 03/23/16  Amyra Vantuyl V Bacon Andersyn Fragoso, PA-C  fluticasone furoate-vilanterol (BREO ELLIPTA) 100-25  MCG/INH AEPB Inhale 1 puff into the lungs daily. 11/25/15   Gabriel Cirriheryl Wicker, NP  rivaroxaban (XARELTO) 20 MG TABS tablet Take 1 tablet (20 mg total) by mouth daily with supper. 12/13/14   Megan P Johnson, DO  venlafaxine (EFFEXOR) 37.5 MG tablet TAKE 1 TABLET(37.5 MG) BY MOUTH TWICE DAILY 12/16/15   Steele SizerMark A Crissman, MD  venlafaxine (EFFEXOR) 37.5 MG tablet TAKE 1 TABLET(37.5 MG) BY MOUTH TWICE DAILY 12/20/15   Megan Holly BodilyP Johnson, DO   Allergies Review of patient's allergies indicates no known allergies.  Family History  Problem Relation Age of Onset  . Hyperlipidemia Mother   . Hyperlipidemia Father   . Heart disease Father   . Diabetes Cousin     Social History Social History  Substance Use Topics  . Smoking status: Current Every Day Smoker    Packs/day: 0.50    Types: Cigarettes  . Smokeless tobacco: Never Used  . Alcohol use 3.6 oz/week    6 Cans of beer per week     Comment: Drinks socially    Review of Systems  Constitutional: Negative for fever. Cardiovascular: Negative for chest pain. Respiratory: Negative for shortness of breath. Gastrointestinal: Negative for abdominal pain, vomiting and diarrhea. Musculoskeletal: Negative for back pain. Left knee pain and swelling.  Skin: Negative for rash. Local erythema to the medial left knee Neurological: Negative for headaches, focal weakness or numbness. ____________________________________________  PHYSICAL EXAM:  VITAL SIGNS: ED Triage Vitals [03/13/16 1401]  Enc Vitals Group     BP 131/78     Pulse Rate (!) 103     Resp 18  Temp 97.9 F (36.6 C)     Temp Source Oral     SpO2 97 %     Weight 140 lb (63.5 kg)     Height 6\' 2"  (1.88 m)     Head Circumference      Peak Flow      Pain Score 7     Pain Loc      Pain Edu?      Excl. in GC?    Constitutional: Alert and oriented. Well appearing and in no distress. Head: Normocephalic and atraumatic. Cardiovascular: Normal rate, regular rhythm.  Respiratory: Normal  respiratory effort. No wheezes/rales/rhonchi. Gastrointestinal: Soft and nontender. No distention. Musculoskeletal: Left knee without obvious deformity but patient is noted to have subtle medial erythema were demarcated to the superficial skin. This also appears to be some scattered streaking anteriorly and proximally on the thigh as well as the posterior aspect of the distal thigh. Nontender with normal range of motion in all extremities.  Neurologic:  Normal gait without ataxia. Normal speech and language. No gross focal neurologic deficits are appreciated. Skin:  Skin is warm, dry and intact. No rash noted. No induration, lesions, or weeping appreciated the left knee. Erythematous macular lesion which blanches but is warm to touch. Psychiatric: Mood and affect are normal. Patient exhibits appropriate insight and judgment. ____________________________________________   LABS (pertinent positives/negatives) Labs Reviewed  CBC WITH DIFFERENTIAL/PLATELET - Abnormal; Notable for the following:       Result Value   WBC 10.7 (*)    RDW 19.6 (*)    Neutro Abs 8.1 (*)    Monocytes Absolute 1.4 (*)    All other components within normal limits  BASIC METABOLIC PANEL  ____________________________________________   RADIOLOGY  LLE Ultrasound/Dooppler  IMPRESSION: No evidence of  lower extremity deep vein thrombosis. ____________________________________________  PROCEDURES  Clindamycin 600 mg IVPB ____________________________________________  INITIAL IMPRESSION / ASSESSMENT AND PLAN / ED COURSE  Patient with what clinically appears to be an erysipelas to the left lower extremity. He is reassured by his negative DVT study at this time given his history. He'll be discharged with a prescription for clindamycin to dose as directed and is encouraged to follow with his primary care provider for reevaluation in 3 days. Return precautions are reviewed.  Clinical Course    ____________________________________________  FINAL CLINICAL IMPRESSION(S) / ED DIAGNOSES  Final diagnoses:  Cellulitis of left lower extremity  Erysipelas      Lissa Hoard, PA-C 03/13/16 1858    Loleta Rose, MD 03/13/16 [phone]

## 2016-05-07 ENCOUNTER — Ambulatory Visit (INDEPENDENT_AMBULATORY_CARE_PROVIDER_SITE_OTHER): Payer: Self-pay | Admitting: Family Medicine

## 2016-05-07 ENCOUNTER — Encounter: Payer: Self-pay | Admitting: Family Medicine

## 2016-05-07 ENCOUNTER — Telehealth: Payer: Self-pay | Admitting: Family Medicine

## 2016-05-07 VITALS — BP 124/81 | HR 79 | Temp 98.2°F | Wt 130.0 lb

## 2016-05-07 DIAGNOSIS — I749 Embolism and thrombosis of unspecified artery: Secondary | ICD-10-CM

## 2016-05-07 DIAGNOSIS — K4090 Unilateral inguinal hernia, without obstruction or gangrene, not specified as recurrent: Secondary | ICD-10-CM

## 2016-05-07 NOTE — Progress Notes (Signed)
BP 124/81   Pulse 79   Temp 98.2 F (36.8 C)   Wt 130 lb (59 kg)   SpO2 95%   BMI 16.69 kg/m    Subjective:    Patient ID: Steve Chaney, male    DOB: [birth year], 60 y.o.   MRN: [id number]  HPI: Steve SalonCharles Crisanti is a 60 y.o. male  Chief Complaint  Patient presents with  . Hernia    has been diagnosed a while back. [facility]  Marybelle Killings. Paperwork    needs paperwork for Xarelto patient assistance filled out.    Patient presents for follow up regarding right inguinal hernia. Now having increasing symptoms with the area and ready for surgery. Has contact info for a patient assistance program to help get his surgery, but [facility]. No redness, abdominal pain, or fever associated, just having pain with activity that resolves with rest.  Also has paperwork for assistance with his xarelto medication that needs to be filled out. Stable on xarelto without CP, SOB, or leg pain.   Relevant past medical, surgical, family and social history reviewed and updated as indicated. Interim medical history since our last visit reviewed. Allergies and medications reviewed and updated.  Review of Systems  Constitutional: Negative.   HENT: Negative.   Respiratory: Negative.   Cardiovascular: Negative.   Gastrointestinal: Positive for abdominal pain (from R inguinal hernia).  Genitourinary: Negative.   Musculoskeletal: Negative.   Skin: Negative.   Neurological: Negative.   Psychiatric/Behavioral: Negative.     Per HPI unless specifically indicated above     Objective:    BP 124/81   Pulse 79   Temp 98.2 F (36.8 C)   Wt 130 lb (59 kg)   SpO2 95%   BMI 16.69 kg/m   Wt Readings from Last 3 Encounters:  05/07/16 130 lb (59 kg)  03/13/16 140 lb (63.5 kg)  08/05/15 130 lb (59 kg)    Physical Exam  Constitutional: He is oriented to person, place, and time. He appears well-developed and well-nourished. No distress.  HENT:  Head: Atraumatic.    Eyes: Conjunctivae are normal. Pupils are equal, round, and reactive to light. No scleral icterus.  Neck: Normal range of motion. Neck supple.  Cardiovascular: Normal rate, regular rhythm and normal heart sounds.   Pulmonary/Chest: Effort normal and breath sounds normal. No respiratory distress.  Abdominal: Soft. Bowel sounds are normal. A hernia is present. Hernia confirmed positive in the right inguinal area (non-strangulated).  Musculoskeletal: Normal range of motion.  Neurological: He is alert and oriented to person, place, and time.  Skin: Skin is warm and dry.  Psychiatric: He has a normal mood and affect. His behavior is normal.  Nursing note and vitals reviewed.     Assessment & Plan:   Problem List Items Addressed This Visit      Cardiovascular and Mediastinum   Arterial thrombosis (HCC)    Pt assistance paperwork filled out and faxed back to [facility] and [facility] for his xarelto. Continue current regimen.        Other Visit Diagnoses    Right inguinal hernia    -  Primary   Referral [facility] placed as he is becoming increasingly symptomatic. He will call about the patient assistance program he found.    Relevant Orders   Ambulatory referral to General Surgery      Follow up plan: Return if symptoms worsen or fail to improve.

## 2016-05-07 NOTE — Patient Instructions (Signed)
Follow up as needed

## 2016-05-07 NOTE — Assessment & Plan Note (Signed)
Pt assistance paperwork filled out and faxed back to [facility] and [facility] for his xarelto. Continue current regimen.

## 2016-05-13 ENCOUNTER — Other Ambulatory Visit: Payer: Self-pay | Admitting: Family Medicine

## 2016-05-13 MED ORDER — RIVAROXABAN 20 MG PO TABS: 20.0000 mg | ORAL_TABLET | Freq: Every day | ORAL | 11 refills | Status: DC

## 2016-05-13 NOTE — Telephone Encounter (Signed)
Opened erroneously

## 2016-05-14 ENCOUNTER — Encounter: Payer: Self-pay | Admitting: *Deleted

## 2016-05-14 ENCOUNTER — Inpatient Hospital Stay
Admission: EM | Admit: 2016-05-14 | Discharge: 2016-05-15 | DRG: 352 | Disposition: A | Discharge status: Home / Self Care | Payer: Self-pay | Attending: Surgery | Admitting: Surgery

## 2016-05-14 DIAGNOSIS — J449 Chronic obstructive pulmonary disease, unspecified: Secondary | ICD-10-CM | POA: Diagnosis present

## 2016-05-14 DIAGNOSIS — K4090 Unilateral inguinal hernia, without obstruction or gangrene, not specified as recurrent: Secondary | ICD-10-CM

## 2016-05-14 DIAGNOSIS — Z7901 Long term (current) use of anticoagulants: Secondary | ICD-10-CM

## 2016-05-14 DIAGNOSIS — Z8249 Family history of ischemic heart disease and other diseases of the circulatory system: Secondary | ICD-10-CM

## 2016-05-14 DIAGNOSIS — Z7982 Long term (current) use of aspirin: Secondary | ICD-10-CM

## 2016-05-14 DIAGNOSIS — Z86711 Personal history of pulmonary embolism: Secondary | ICD-10-CM

## 2016-05-14 DIAGNOSIS — F17210 Nicotine dependence, cigarettes, uncomplicated: Secondary | ICD-10-CM | POA: Diagnosis present

## 2016-05-14 DIAGNOSIS — Z79899 Other long term (current) drug therapy: Secondary | ICD-10-CM

## 2016-05-14 DIAGNOSIS — K4030 Unilateral inguinal hernia, with obstruction, without gangrene, not specified as recurrent: Principal | ICD-10-CM

## 2016-05-14 DIAGNOSIS — Z9114 Patient's other noncompliance with medication regimen: Secondary | ICD-10-CM

## 2016-05-14 DIAGNOSIS — F329 Major depressive disorder, single episode, unspecified: Secondary | ICD-10-CM | POA: Diagnosis present

## 2016-05-14 LAB — CBC
HEMATOCRIT: 51.3 % (ref 40.0–52.0)
Hemoglobin: 17.7 g/dL (ref 13.0–18.0)
MCH: 31.3 pg (ref 26.0–34.0)
MCHC: 34.4 g/dL (ref 32.0–36.0)
MCV: 90.9 fL (ref 80.0–100.0)
Platelets: 374 10*3/uL (ref 150–440)
RBC: 5.65 MIL/uL (ref 4.40–5.90)
RDW: 16.7 % — AB (ref 11.5–14.5)
WBC: 12 10*3/uL — ABNORMAL HIGH (ref 3.8–10.6)

## 2016-05-14 LAB — BASIC METABOLIC PANEL
Anion gap: 9 (ref 5–15)
BUN: 9 mg/dL (ref 6–20)
CALCIUM: 9.1 mg/dL (ref 8.9–10.3)
CO2: 27 mmol/L (ref 22–32)
CREATININE: 0.92 mg/dL (ref 0.61–1.24)
Chloride: 98 mmol/L — ABNORMAL LOW (ref 101–111)
GFR calc Af Amer: >60 mL/min (ref >60)
GFR calc non Af Amer: >60 mL/min (ref >60)
GLUCOSE: 118 mg/dL — AB (ref 65–99)
Potassium: 4.1 mmol/L (ref 3.5–5.1)
Sodium: 134 mmol/L — ABNORMAL LOW (ref 135–145)

## 2016-05-14 MED ORDER — ONDANSETRON HCL 4 MG PO TABS: 4.0000 mg | ORAL_TABLET | Freq: Four times a day (QID) | ORAL | Status: DC | PRN

## 2016-05-14 MED ORDER — ONDANSETRON HCL 4 MG/2ML IJ SOLN
4.0000 mg | Freq: Four times a day (QID) | INTRAMUSCULAR | Status: DC | PRN
Start: 2016-05-14 — End: 2016-05-15

## 2016-05-14 MED ORDER — HEPARIN SODIUM (PORCINE) 5000 UNIT/ML IJ SOLN
5000.0000 [IU] | Freq: Three times a day (TID) | INTRAMUSCULAR | Status: DC
  Administered 2016-05-14 – 2016-05-15 (×2): 5000 [IU] via SUBCUTANEOUS
  Filled 2016-05-14 (×2): qty 1

## 2016-05-14 MED ORDER — MORPHINE SULFATE (PF) 2 MG/ML IV SOLN: 2.0000 mg | INTRAVENOUS | Status: DC | PRN

## 2016-05-14 MED ORDER — MORPHINE SULFATE (PF) 2 MG/ML IV SOLN
4.0000 mg | Freq: Once | INTRAVENOUS | Status: AC
  Administered 2016-05-14: 4 mg via INTRAVENOUS
  Filled 2016-05-14: qty 2

## 2016-05-14 MED ORDER — DEXTROSE-NACL 5-0.9 % IV SOLN
INTRAVENOUS | Status: DC
  Administered 2016-05-14: via INTRAVENOUS

## 2016-05-14 MED ORDER — ONDANSETRON HCL 4 MG/2ML IJ SOLN
4.0000 mg | Freq: Once | INTRAMUSCULAR | Status: AC
  Administered 2016-05-14: 4 mg via INTRAVENOUS
  Filled 2016-05-14: qty 2

## 2016-05-14 MED ORDER — CEFAZOLIN SODIUM-DEXTROSE 2-4 GM/100ML-% IV SOLN
2.0000 g | INTRAVENOUS | Status: AC
  Administered 2016-05-15: 2 g via INTRAVENOUS
  Filled 2016-05-14: qty 100

## 2016-05-14 NOTE — ED Provider Notes (Addendum)
[facility] Emergency Department Provider Note   ____________________________________________   I have reviewed the triage vital signs and the nursing notes.   HISTORY  Chief Complaint Hernia and Groin Swelling   History limited by: Not Limited   HPI Steve Chaney is a 60 y.o. male who presents to the emergency department today because of concern for right sided scrotal swelling and hernia. The patient states the symptoms started roughly 30 hours ago. He has a history of hernia on the right side but states that he typically is able to reduce it. He did try to reduce it the past thirty hours but has not been able to. It is severely painful and is accompanied by nausea. States that he has had normal bowel movements and has continued to urinate normally. No fevers.   Past Medical History:  Diagnosis Date  . Blood clot in vein   . Pulmonary embolism [facility](HCC)     Patient Active Problem List   Diagnosis Date Noted  . Incarcerated right inguinal hernia 05/14/2016  . Depression 08/05/2015  . Inguinal hernia unilateral, non-recurrent 05/13/2015  . COPD (chronic obstructive pulmonary disease) (HCC) 05/13/2015  . 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 blood clot    . TONSILLECTOMY      Prior to Admission medications   Medication Sig Start Date End Date Taking? Authorizing Provider  albuterol (PROVENTIL HFA;VENTOLIN HFA) 108 (90 BASE) MCG/ACT inhaler Inhale 2 puffs into the lungs every 6 (six) hours as needed for wheezing or shortness of breath. 05/13/15  Yes Megan P Johnson, DO  aspirin 81 MG chewable tablet Chew 81 mg by mouth daily.   Yes Historical Provider, MD  fluticasone furoate-vilanterol (BREO ELLIPTA) 100-25 MCG/INH AEPB Inhale 1 puff into the lungs daily. 11/25/15  Yes Gabriel Cirriheryl Wicker, NP  rivaroxaban (XARELTO) 20 MG TABS tablet Take 1 tablet (20 mg total) by mouth daily with supper. 05/13/16  Yes  Particia Nearingachel Elizabeth Lane, PA-C    Allergies Review of patient's allergies indicates no known allergies.  Family History  Problem Relation Age of Onset  . Hyperlipidemia Mother   . Hyperlipidemia Father   . Heart disease Father   . Diabetes Cousin     Social History Social History  Substance Use Topics  . Smoking status: Current Every Day Smoker    Packs/day: 0.50    Types: Cigarettes  . Smokeless tobacco: Never Used  . Alcohol use 3.6 oz/week    6 Cans of beer per week     Comment: Drinks socially    Review of Systems  Constitutional: Negative for fever. Cardiovascular: Negative for chest pain. Respiratory: Negative for shortness of breath. Gastrointestinal: Negative for abdominal pain. Positive for nausea.  Genitourinary: Positive for right scrotal swelling.  Musculoskeletal: Negative for back pain. Skin: Negative for rash. Neurological: Negative for headaches, focal weakness or numbness.  10-point ROS otherwise negative.  ____________________________________________   PHYSICAL EXAM:  VITAL SIGNS: ED Triage Vitals  Enc Vitals Group     BP 05/14/16 1924 102/74     Pulse Rate 05/14/16 1924 66     Resp 05/14/16 1924 (!) 22     Temp 05/14/16 1924 98.6 F (37 C)     Temp Source 05/14/16 1924 Oral     SpO2 05/14/16 1924 98 %     Weight 05/14/16 1926 135 lb (61.2 kg)     Height 05/14/16 1926 6\' 2"  (1.88 m)  Head Circumference --      Peak Flow --      Pain Score 05/14/16 1926 9   Constitutional: Alert and oriented. Well appearing and in no distress. Eyes: Conjunctivae are normal. Normal extraocular movements. ENT   Head: Normocephalic and atraumatic.   Nose: No congestion/rhinnorhea.   Mouth/Throat: Mucous membranes are moist.   Neck: No stridor. Hematological/Lymphatic/Immunilogical: No cervical lymphadenopathy. Cardiovascular: Normal rate, regular rhythm.  No murmurs, rubs, or gallops. Respiratory: Normal respiratory effort without  tachypnea nor retractions. Breath sounds are clear and equal bilaterally. No wheezes/rales/rhonchi. Gastrointestinal: Soft and nontender. No distention.  Genitourinary: Large right sided scrotal swelling and mass consistent with hernia. Unable to be reduced.  Musculoskeletal: Normal range of motion in all extremities. No lower extremity edema. Neurologic:  Normal speech and language. No gross focal neurologic deficits are appreciated.  Skin:  Skin is warm, dry and intact. No rash noted. Psychiatric: Mood and affect are normal. Speech and behavior are normal. Patient exhibits appropriate insight and judgment.  ____________________________________________    LABS (pertinent positives/negatives)  Labs Reviewed  CBC - Abnormal; Notable for the following:       Result Value   WBC 12.0 (*)    RDW 16.7 (*)    All other components within normal limits  BASIC METABOLIC PANEL - Abnormal; Notable for the following:    Sodium 134 (*)    Chloride 98 (*)    Glucose, Bld 118 (*)    All other components within normal limits     ____________________________________________   EKG  None  ____________________________________________    RADIOLOGY  None  ____________________________________________   PROCEDURES  Procedures  ____________________________________________   INITIAL IMPRESSION / ASSESSMENT AND PLAN / ED COURSE  Pertinent labs & imaging results that were available during my care of the patient were reviewed by me and considered in my medical decision making (see chart for details).  Patient with large right sided hernia and scrotal swelling. Patient is passing gas. Unable to reduce. Surgery was consulted and will admit.   ___________________________________   FINAL CLINICAL IMPRESSION(S) / ED DIAGNOSES  Final diagnoses:  Right inguinal hernia     Note: This dictation was prepared with Dragon dictation. Any transcriptional errors that result from this process  are unintentional    Phineas SemenGraydon Heleena Miceli, MD 05/14/16 [id number]    Phineas SemenGraydon Aileana Hodder, MD 05/14/16 2320

## 2016-05-14 NOTE — ED Notes (Signed)
Transporting patient to room 149-1A

## 2016-05-14 NOTE — ED Notes (Signed)
Pt c/o RT hernia that has swelling noted into scrotum. Pt states started this am. C/o nausea. Denies any injuries to area

## 2016-05-14 NOTE — Anesthesia Preprocedure Evaluation (Addendum)
Anesthesia Evaluation  Patient identified by MRN, date of birth, ID band Patient awake    Reviewed: Allergy & Precautions, NPO status , Patient's Chart, lab work & pertinent test results, reviewed documented beta blocker date and time   Airway Mallampati: II  TM Distance: >3 FB     Dental  (+) Chipped   Pulmonary COPD, Current Smoker,           Cardiovascular      Neuro/Psych PSYCHIATRIC DISORDERS Depression    GI/Hepatic   Endo/Other    Renal/GU      Musculoskeletal   Abdominal   Peds  Hematology   Anesthesia Other Findings Hx of PE. This is a semi-emergency. Will proceed with Rapid sequence.  Reproductive/Obstetrics                            Anesthesia Physical Anesthesia Plan  ASA: III  Anesthesia Plan: General   Post-op Pain Management:    Induction: Intravenous and Rapid sequence  Airway Management Planned: Oral ETT  Additional Equipment:   Intra-op Plan:   Post-operative Plan:   Informed Consent: I have reviewed the patients History and Physical, chart, labs and discussed the procedure including the risks, benefits and alternatives for the proposed anesthesia with the patient or authorized representative who has indicated his/her understanding and acceptance.     Plan Discussed with: CRNA  Anesthesia Plan Comments:         Anesthesia Quick Evaluation

## 2016-05-14 NOTE — ED Triage Notes (Signed)
Pt reports he has a right side hernia for approx 4 years.  Last night pt began having swelling of scrotum.  Pt has intense pain and swelling to scrotum.  No diff urinating.

## 2016-05-14 NOTE — H&P (Signed)
Leonarda SalonCharles Sirois is an 60 y.o. male.    Chief Complaint: Right groin pain  HPI: This patient with a long-standing right inguinal hernia is been present for many years. Over the last 2 weeks she's had increasing size but has been able to reduce it but in the last 3 days he has not been able to reduce it and over the last 36 hours is caused him considerable pain. He's had no nausea or vomiting and is passing gas but has not had a bowel movement today.  Of note the patient had blood clots and pulmonary emboli and was treated with some sort of catheter therapy several years ago. He is on Xarelto but has not taken a dose for 4 days because he has not been able to afford the medication.  He surfaced regularly smokes tobacco and drinks alcohol daily  Past Medical History:  Diagnosis Date  . Blood clot in vein   . Pulmonary embolism [facility](HCC)     Past Surgical History:  Procedure Laterality Date  . blood clot    . TONSILLECTOMY      Family History  Problem Relation Age of Onset  . Hyperlipidemia Mother   . Hyperlipidemia Father   . Heart disease Father   . Diabetes Cousin    Social History:  reports that he has been smoking Cigarettes.  He has been smoking about 0.50 packs per day. He has never used smokeless tobacco. He reports that he drinks about 3.6 oz of alcohol per week . He reports that he does not use drugs.  Allergies: No Known Allergies   (Not in a hospital admission)   Review of Systems  Constitutional: Negative for chills, fever and weight loss.  HENT: Negative.   Eyes: Negative.   Respiratory: Negative.   Cardiovascular: Negative.   Gastrointestinal: Positive for nausea. Negative for abdominal pain, blood in stool, constipation, diarrhea, heartburn, melena and vomiting.  Genitourinary: Negative.   Musculoskeletal: Negative.   Skin: Negative.   Neurological: Negative.   Endo/Heme/Allergies: Negative.   Psychiatric/Behavioral: Negative.      Physical Exam:  BP  (!) 147/87   Pulse 66   Temp 98.6 F (37 C) (Oral)   Resp (!) 22   Ht 6\' 2"  (1.88 m)   Wt 135 lb (61.2 kg)   SpO2 98%   BMI 17.33 kg/m   Physical Exam  Constitutional: He is oriented to person, place, and time. No distress.  Very thin-appearing male patient moves about the bed quite easily without splinting  HENT:  Head: Normocephalic and atraumatic.  Eyes: Pupils are equal, round, and reactive to light. Right eye exhibits no discharge. Left eye exhibits no discharge. No scleral icterus.  Neck: Normal range of motion.  Cardiovascular: Normal rate, regular rhythm and normal heart sounds.   Pulmonary/Chest: Effort normal and breath sounds normal. No respiratory distress. He has no wheezes. He has no rales.  Abdominal: Soft. He exhibits distension. There is tenderness. There is no rebound and no guarding.  Genitourinary: Penis normal. No discharge found.  Genitourinary Comments: Huge right scrotal inguinal hernia with erythema Tenderness is present and it is not reducible.  Musculoskeletal: Normal range of motion. He exhibits no edema or tenderness.  Lymphadenopathy:    He has no cervical adenopathy.  Neurological: He is alert and oriented to person, place, and time.  Skin: Skin is warm and dry. No rash noted. He is not diaphoretic. There is erythema.  Psychiatric: Mood and affect normal.  Vitals reviewed.  No results found for this or any previous visit (from the past 48 hour(s)). No results found.   Assessment/Plan  This a patient with an incarcerated right inguinal hernia he ate one bite of a cheeseburger 3 hours ago and I discussed with anesthesia that this is an emergency due to the erythema and the concern for incarcerated bowel and compromise. He agreed to proceed at midnight.  I discussed with patient and friends the rationale for offering emergency surgery and the options of observation. Although his nothing by mouth status is compromised that has been  addressed with anesthesia. He has not taken his Xarelto in 4 days due to financial concerns but he is still at risk for bleeding and that risk of bleeding increases his risk for infection therefore no mesh can be utilized and the laparoscopic approach is not an option. I discussed with him the procedure and the high risk of recurrence and also the risk of bleeding and infection he understood and agreed to proceed with this plan.  Lattie Hawichard E Hymen Arnett, MD, FACS

## 2016-05-15 ENCOUNTER — Observation Stay: Payer: Self-pay | Admitting: Anesthesiology

## 2016-05-15 ENCOUNTER — Encounter: Payer: Self-pay | Admitting: Surgery

## 2016-05-15 ENCOUNTER — Encounter
Admission: EM | Disposition: A | Discharge status: Home / Self Care | Payer: Self-pay | Source: Home / Self Care | Attending: Surgery

## 2016-05-15 DIAGNOSIS — K4090 Unilateral inguinal hernia, without obstruction or gangrene, not specified as recurrent: Secondary | ICD-10-CM

## 2016-05-15 HISTORY — PX: INGUINAL HERNIA REPAIR: SHX194

## 2016-05-15 LAB — SURGICAL PCR SCREEN
MRSA, PCR: NEGATIVE
Staphylococcus aureus: NEGATIVE

## 2016-05-15 SURGERY — REPAIR, HERNIA, INGUINAL, INCARCERATED
Anesthesia: General | Laterality: Right

## 2016-05-15 MED ORDER — MIDAZOLAM HCL 2 MG/2ML IJ SOLN
INTRAMUSCULAR | Status: DC | PRN
  Administered 2016-05-15: 2 mg via INTRAVENOUS

## 2016-05-15 MED ORDER — FENTANYL CITRATE (PF) 100 MCG/2ML IJ SOLN
25.0000 ug | INTRAMUSCULAR | Status: AC | PRN
  Administered 2016-05-15 (×6): 25 ug via INTRAVENOUS

## 2016-05-15 MED ORDER — ONDANSETRON HCL 4 MG/2ML IJ SOLN
4.0000 mg | Freq: Once | INTRAMUSCULAR | Status: DC | PRN
Start: 2016-05-15 — End: 2016-05-15

## 2016-05-15 MED ORDER — ROCURONIUM BROMIDE 100 MG/10ML IV SOLN
INTRAVENOUS | Status: DC | PRN
  Administered 2016-05-15: 20 mg via INTRAVENOUS

## 2016-05-15 MED ORDER — BUPIVACAINE-EPINEPHRINE (PF) 0.25% -1:200000 IJ SOLN
INTRAMUSCULAR | Status: DC | PRN
  Administered 2016-05-15: 10 mL via PERINEURAL
  Administered 2016-05-15: 20 mL via PERINEURAL

## 2016-05-15 MED ORDER — FENTANYL CITRATE (PF) 100 MCG/2ML IJ SOLN
INTRAMUSCULAR | Status: AC
  Filled 2016-05-15: qty 2

## 2016-05-15 MED ORDER — HYDROCODONE-ACETAMINOPHEN 5-325 MG PO TABS: 1.0000 | ORAL_TABLET | ORAL | 0 refills | Status: DC | PRN

## 2016-05-15 MED ORDER — HYDROCODONE-ACETAMINOPHEN 5-325 MG PO TABS: 1.0000 | ORAL_TABLET | ORAL | Status: DC | PRN

## 2016-05-15 MED ORDER — FLUTICASONE FUROATE-VILANTEROL 100-25 MCG/INH IN AEPB
1.0000 | INHALATION_SPRAY | Freq: Every day | RESPIRATORY_TRACT | Status: DC
  Administered 2016-05-15: 1 via RESPIRATORY_TRACT
  Filled 2016-05-15: qty 28

## 2016-05-15 MED ORDER — LACTATED RINGERS IV SOLN
INTRAVENOUS | Status: DC | PRN
  Administered 2016-05-15: via INTRAVENOUS

## 2016-05-15 MED ORDER — GLYCOPYRROLATE 0.2 MG/ML IJ SOLN
INTRAMUSCULAR | Status: DC | PRN
  Administered 2016-05-15: 0.2 mg via INTRAVENOUS

## 2016-05-15 MED ORDER — LIDOCAINE HCL (CARDIAC) 20 MG/ML IV SOLN
INTRAVENOUS | Status: DC | PRN
  Administered 2016-05-15: 100 mg via INTRAVENOUS

## 2016-05-15 MED ORDER — ALBUTEROL SULFATE (2.5 MG/3ML) 0.083% IN NEBU: 3.0000 mL | INHALATION_SOLUTION | Freq: Four times a day (QID) | RESPIRATORY_TRACT | Status: DC | PRN

## 2016-05-15 MED ORDER — ASPIRIN 81 MG PO CHEW
81.0000 mg | CHEWABLE_TABLET | Freq: Every day | ORAL | Status: DC
  Administered 2016-05-15: 81 mg via ORAL
  Filled 2016-05-15: qty 1

## 2016-05-15 MED ORDER — PROPOFOL 10 MG/ML IV BOLUS
INTRAVENOUS | Status: DC | PRN
  Administered 2016-05-15: 140 mg via INTRAVENOUS

## 2016-05-15 MED ORDER — RIVAROXABAN 20 MG PO TABS: 20.0000 mg | ORAL_TABLET | Freq: Every day | ORAL | Status: DC

## 2016-05-15 MED ORDER — HYDROCODONE-ACETAMINOPHEN 5-300 MG PO TABS: 1.0000 | ORAL_TABLET | ORAL | 0 refills | Status: DC | PRN

## 2016-05-15 MED ORDER — HEPARIN SODIUM (PORCINE) 5000 UNIT/ML IJ SOLN: 5000.0000 [IU] | Freq: Three times a day (TID) | INTRAMUSCULAR | Status: DC

## 2016-05-15 MED ORDER — DEXAMETHASONE SODIUM PHOSPHATE 10 MG/ML IJ SOLN
INTRAMUSCULAR | Status: DC | PRN
  Administered 2016-05-15: 10 mg via INTRAVENOUS

## 2016-05-15 MED ORDER — SUCCINYLCHOLINE CHLORIDE 20 MG/ML IJ SOLN
INTRAMUSCULAR | Status: DC | PRN
  Administered 2016-05-15: 100 mg via INTRAVENOUS

## 2016-05-15 MED ORDER — ONDANSETRON HCL 4 MG/2ML IJ SOLN
INTRAMUSCULAR | Status: DC | PRN
  Administered 2016-05-15: 4 mg via INTRAVENOUS

## 2016-05-15 MED ORDER — SUGAMMADEX SODIUM 200 MG/2ML IV SOLN
INTRAVENOUS | Status: DC | PRN
  Administered 2016-05-15: 120 mg via INTRAVENOUS

## 2016-05-15 MED ORDER — FENTANYL CITRATE (PF) 100 MCG/2ML IJ SOLN
INTRAMUSCULAR | Status: DC | PRN
  Administered 2016-05-15 (×2): 50 ug via INTRAVENOUS

## 2016-05-15 SURGICAL SUPPLY — 37 items
ADHESIVE MASTISOL STRL (MISCELLANEOUS) IMPLANT
BLADE SURG 15 STRL LF DISP TIS (BLADE) ×1 IMPLANT
BLADE SURG 15 STRL SS (BLADE) ×2
CANISTER SUCT 1200ML W/VALVE (MISCELLANEOUS) ×3 IMPLANT
CHLORAPREP W/TINT 26ML (MISCELLANEOUS) ×3 IMPLANT
CLOSURE WOUND 1/2 X4 (GAUZE/BANDAGES/DRESSINGS)
DRAIN PENROSE 1/4X12 LTX (DRAIN) ×3 IMPLANT
DRAPE LAPAROTOMY 100X77 ABD (DRAPES) ×3 IMPLANT
DRSG TELFA 3X8 NADH (GAUZE/BANDAGES/DRESSINGS) IMPLANT
ELECT REM PT RETURN 9FT ADLT (ELECTROSURGICAL) ×3
ELECTRODE REM PT RTRN 9FT ADLT (ELECTROSURGICAL) ×1 IMPLANT
GAUZE SPONGE 4X4 12PLY STRL (GAUZE/BANDAGES/DRESSINGS) ×3 IMPLANT
GLOVE BIO SURGEON STRL SZ8 (GLOVE) ×6 IMPLANT
GOWN STRL REUS W/ TWL LRG LVL3 (GOWN DISPOSABLE) ×2 IMPLANT
GOWN STRL REUS W/TWL LRG LVL3 (GOWN DISPOSABLE) ×4
LABEL OR SOLS (LABEL) IMPLANT
NDL SAFETY 22GX1.5 (NEEDLE) ×3 IMPLANT
NS IRRIG 500ML POUR BTL (IV SOLUTION) ×3 IMPLANT
PACK BASIN MINOR ARMC (MISCELLANEOUS) ×3 IMPLANT
SPONGE LAP 18X18 5 PK (GAUZE/BANDAGES/DRESSINGS) ×3 IMPLANT
STAPLER SKIN PROX 35W (STAPLE) ×3 IMPLANT
STRIP CLOSURE SKIN 1/2X4 (GAUZE/BANDAGES/DRESSINGS) IMPLANT
SUT ETHIBOND 0 (SUTURE) ×12 IMPLANT
SUT ETHIBOND CT1 BRD #0 30IN (SUTURE) IMPLANT
SUT ETHIBOND NAB CT1 #1 30IN (SUTURE) IMPLANT
SUT MNCRL 4-0 (SUTURE) ×2
SUT MNCRL 4-0 27XMFL (SUTURE) ×1
SUT PROLENE 0 CT 1 30 (SUTURE) IMPLANT
SUT PROLENE 1 CT 1 30 (SUTURE) IMPLANT
SUT SILK 0 (SUTURE) ×2
SUT SILK 0 30XBRD TIE 6 (SUTURE) ×1 IMPLANT
SUT VIC AB 0 CT1 36 (SUTURE) ×3 IMPLANT
SUT VIC AB 3-0 SH 27 (SUTURE) ×4
SUT VIC AB 3-0 SH 27X BRD (SUTURE) ×2 IMPLANT
SUTURE MNCRL 4-0 27XMF (SUTURE) ×1 IMPLANT
SYRINGE 10CC LL (SYRINGE) ×3 IMPLANT
TRAY FOLEY W/METER SILVER 16FR (SET/KITS/TRAYS/PACK) ×3 IMPLANT

## 2016-05-15 NOTE — Care Management Note (Signed)
Case Management Note  Patient Details  Name: Steve Chaney MRN: [id number] Date of Birth: [birth year]  Subjective/Objective:   Met with patient at bedside. He has had medication assistance application completed for Xarelto by PCP. This will pay for his xarelto. Application given for [facility] for assistance with other medications. They can also supply his xarelto if needed. Patient independent with adls and ambulatory. No other needs identified.                   Action/Plan: Medication Management application given.  Expected Discharge Date:    05/15/2016              Expected Discharge Plan:  Home/Self Care  In-House Referral:     Discharge planning Services  CM Consult, Medication Assistance  Post Acute Care Choice:    Choice offered to:     DME Arranged:    DME Agency:     HH Arranged:    HH Agency:     Status of Service:  Completed, signed off  If discussed at [employer] of Stay Meetings, dates discussed:    Additional Comments:  Jolly Mango, RN 05/15/2016, 10:39 AM

## 2016-05-15 NOTE — Anesthesia Procedure Notes (Signed)
Procedure Name: Intubation Date/Time: 05/15/2016 12:18 AM Performed by: Irving BurtonBACHICH, Maddisen Vought Pre-anesthesia Checklist: Patient identified, Emergency Drugs available, Suction available and Patient being monitored Patient Re-evaluated:Patient Re-evaluated prior to inductionOxygen Delivery Method: Circle system utilized Preoxygenation: Pre-oxygenation with 100% oxygen Intubation Type: Rapid sequence and IV induction Laryngoscope Size: Mac and 4 Grade View: Grade I Tube type: Oral Tube size: 7.5 mm Number of attempts: 2 Airway Equipment and Method: Stylet Placement Confirmation: ETT inserted through vocal cords under direct vision,  positive ETCO2 and breath sounds checked- equal and bilateral Secured at: 21 cm Dental Injury: Teeth and Oropharynx as per pre-operative assessment

## 2016-05-15 NOTE — Op Note (Signed)
Repair of incarcerated right Inguinal Hernia   Leonarda SalonCharles Heinrichs  05/15/2016  Pre-operative Diagnosis: Incarcerated right Inguinal Hernia  Post-operative Diagnosis: Incarcerated right  Inguinal hernia  Procedure:  repair of incarcerated right inguinal hernia   Surgeon: Adah Salvageichard E. Excell Seltzerooper, MD FACS  Anesthesia: Gen. with endotracheal tube  Assistant: Surgical tech  Procedure Details  The patient was seen again in the Holding Room. The benefits, complications, treatment options, and expected outcomes were discussed with the patient. The risks of bleeding, infection, recurrence of symptoms, failure to resolve symptoms, recurrence of hernia, ischemic orchitis, chronic pain syndrome or neuroma, were discussed again. Because this was a long-standing and neglected scrotal hernia with incarceration it was discussed that he has a high likelihood of recurrence and mesh cannot be utilized in this situation. The likelihood of improving the patient's symptoms with return to their baseline status is good.  The patient and/or family concurred with the proposed plan, giving informed consent.  The patient was taken to Operating Room, identified as Leonarda Salonharles Galicia and the procedure verified as repair of incarcerated right Inguinal Hernia . Laterality confirmed.  A Time Out was held and the above information confirmed.  Prior to the induction of general anesthesia, antibiotic prophylaxis was administered. VTE prophylaxis was in place. General endotracheal anesthesia was then administered and tolerated well. A Foley catheter was placed by the nursing staff. After the induction, the abdomen was prepped with Chloraprep and draped in the sterile fashion. The patient was positioned in the supine position.  Local anesthetic  was injected into the skin near the ASIS to perform a nerve block.  An incision was made and dissection down to the hernia sac was performed the hernia sac was opened and a large amount of omentum  and colon was brought into the wound. It was clear that this was actually the transverse colon as it contained the entirety of the omentum. It was not possible to reduce this back into the abdominal cavity therefore the omentum was removed in its entirety by dividing vessels between clamps and ligating with 0 silk ligature. The transverse colon was then reduced back into the abdominal cavity. The hernia sac after dissection was amputated and closed with running 2-0 Vicryl. Dissection was performed to delineate Jahzion Brogden's ligament and the lateral extent of dissection was determined.  Once this was complete, a Bassini type repair was performed using 0 Ethibonds. This was a classic Engineer, manufacturingBassini repair approximating transversalis fascia to iliopubic tract. Once assuring that hemostasis was adequate and the repair was adequate the external bike was closed with running 0 Vicryl. Deep sutures of 3-0 Vicryl were placed followed by skin staples. Additional Marcaine was placed for a total of 30 cc  Patient tolerated the procedure well. There were no complications. He was taken to the recovery room in stable condition to be discharged to the care of his family and follow-up in 10 days.    Findings: Huge scrotal hernia involving the entirety of the omentum and transverse colon.                       Pheonix Wisby E. Excell Seltzerooper, MD, FACS

## 2016-05-15 NOTE — Progress Notes (Signed)
05/15/2016  Subjective: Patient is s/p incarcerated right inguinal hernia repair.  No acute events overnight.  Patient has been tolerating a diet, with pain well controlled with oral regimen.    Vital signs: Temp:  [96.9 F (36.1 C)-99.7 F (37.6 C)] 98 F (36.7 C) (11/03 1438) Pulse Rate:  [66-106] 67 (11/03 1438) Resp:  [10-24] 16 (11/03 1438) BP: (102-149)/(74-99) 131/77 (11/03 1438) SpO2:  [85 %-100 %] 100 % (11/03 1438) Weight:  [59.1 kg (130 lb 3.2 oz)-61.2 kg (135 lb)] 59.1 kg (130 lb 3.2 oz) (11/02 2330)   Intake/Output: 11/02 0701 - 11/03 0700 In: 400 [I.V.:400] Out: 1500 [Urine:800] Last BM Date: 05/15/16  Physical Exam: Constitutional:  No acute distress Abdomen: soft, non-distended, appropriately tender to palpation over the right groin.  Incision c/d/i with staples.  Minimal swelling.   Labs:   Recent Labs  05/14/16 1938  WBC 12.0*  HGB 17.7  HCT 51.3  PLT 374    Recent Labs  05/14/16 1938  NA 134*  K 4.1  CL 98*  CO2 27  GLUCOSE 118*  BUN 9  CREATININE 0.92  CALCIUM 9.1   No results for input(s): LABPROT, INR in the last 72 hours.  Imaging: No results found.  Assessment/Plan: 60 yo male s/p repair of incarcerated right inguinal hernia.  --Patient doing well, with pain well controlled.  Ambulating, tolerating diet, voiding, and having flatus and bowel movements. --Will d/c home today --continue with pressure dressing over right groin to prevent/minimize seroma formation.   Howie IllJose Luis Rosangelica Pevehouse, MD [facility]

## 2016-05-15 NOTE — Transfer of Care (Signed)
Immediate Anesthesia Transfer of Care Note  Patient: Steve Chaney  Procedure(s) Performed: Procedure(s): HERNIA REPAIR INGUINAL INCARCERATED (Right)  Patient Location: PACU  Anesthesia Type:General  Level of Consciousness: awake and alert   Airway & Oxygen Therapy: Patient connected to face mask oxygen  Post-op Assessment: Post -op Vital signs reviewed and stable  Post vital signs: stable  Last Vitals:  Vitals:   05/14/16 2324 05/15/16 0131  BP: (!) 148/99 135/90  Pulse: 82 93  Resp: 19 19  Temp: 37.6 C 36.3 C    Last Pain:  Vitals:   05/15/16 0131  TempSrc: Temporal  PainSc:          Complications: No apparent anesthesia complications

## 2016-05-15 NOTE — Anesthesia Postprocedure Evaluation (Signed)
Anesthesia Post Note  Patient: Leonarda SalonCharles Silliman  Procedure(s) Performed: Procedure(s) (LRB): HERNIA REPAIR INGUINAL INCARCERATED (Right)  Patient location during evaluation: PACU Anesthesia Type: General Level of consciousness: awake and alert Pain management: pain level controlled Vital Signs Assessment: post-procedure vital signs reviewed and stable Respiratory status: spontaneous breathing, nonlabored ventilation, respiratory function stable and patient connected to nasal cannula oxygen Cardiovascular status: blood pressure returned to baseline and stable Postop Assessment: no signs of nausea or vomiting Anesthetic complications: no    Last Vitals:  Vitals:   05/15/16 0248 05/15/16 0737  BP: 112/77 125/80  Pulse: 79 74  Resp: 19 18  Temp: 36.9 C 36.9 C    Last Pain:  Vitals:   05/15/16 0737  TempSrc: Oral  PainSc:                  Julya Alioto S

## 2016-05-15 NOTE — Discharge Summary (Signed)
Patient ID: Leonarda SalonCharles Zywicki MRN: [id number] DOB/AGE: 09-17-1955 60 y.o.  Admit date: 05/14/2016 Discharge date: 05/15/2016   Discharge Diagnoses:  Active Problems:   Incarcerated right inguinal hernia   Right inguinal hernia   Procedures:  Incarcerated right inguinal hernia repair  Hospital Course: Patient was admitted on 11/2 with an incarcerated right inguinal hernia.  He was taken to the operating room and underwent a right inguinal hernia repair in Bassini technique.  Patient tolerated the procedure well.  His diet was slowly advanced and was transitioned to oral pain medication.  He was ambulating, passing flatus and had a bowel movement, was voiding without issues, and his pain was well controlled.  He was deemed ready for discharge.  Consults: None  Disposition: 01-Home or Self Care  Discharge Instructions    Call MD for:  difficulty breathing, headache or visual disturbances    Complete by:  As directed    Call MD for:  persistant nausea and vomiting    Complete by:  As directed    Call MD for:  redness, tenderness, or signs of infection (pain, swelling, redness, odor or green/yellow discharge around incision site)    Complete by:  As directed    Call MD for:  severe uncontrolled pain    Complete by:  As directed    Call MD for:  temperature >100.4    Complete by:  As directed    Change dressing (specify)    Complete by:  As directed    Apply dry gauze with tape over right groin incision to apply gentle pressure.  Change once daily and as needed.   Diet - low sodium heart healthy    Complete by:  As directed    Discharge instructions    Complete by:  As directed    Patient may shower, but do not scrub wound heavily and dab dry only.  Do not submerge wound in pool/tub.  Do not remove staples.   Driving Restrictions    Complete by:  As directed    Do not drive while taking narcotics for pain control.   Increase activity slowly    Complete by:  As directed    Lifting  restrictions    Complete by:  As directed    No heavy lifting of more than 10 lbs for 4 weeks.  Walking and going up/down stairs is ok.       Medication List    TAKE these medications   albuterol 108 (90 Base) MCG/ACT inhaler Commonly known as:  PROVENTIL HFA;VENTOLIN HFA Inhale 2 puffs into the lungs every 6 (six) hours as needed for wheezing or shortness of breath.   aspirin 81 MG chewable tablet Chew 81 mg by mouth daily.   fluticasone furoate-vilanterol 100-25 MCG/INH Aepb Commonly known as:  BREO ELLIPTA Inhale 1 puff into the lungs daily.   Hydrocodone-Acetaminophen 5-300 MG Tabs Commonly known as:  VICODIN Take 1 tablet by mouth every 4 (four) hours as needed.   HYDROcodone-acetaminophen 5-325 MG tablet Commonly known as:  NORCO/VICODIN Take 1 tablet by mouth every 4 (four) hours as needed for moderate pain.   rivaroxaban 20 MG Tabs tablet Commonly known as:  XARELTO Take 1 tablet (20 mg total) by mouth daily with supper.      Follow-up Information    Dionne Miloichard Cooper, MD Follow up in 1 week(s).   Specialty:  Surgery Contact information: [address] [place] [id number] [phone]

## 2016-05-15 NOTE — Discharge Instructions (Signed)
May shower in 24 hours No lifting 4 weeks See Dr Excell Seltzerooper in office next week Resume home meds  Information on my medicine - XARELTO (rivaroxaban)   WHY WAS XARELTO PRESCRIBED FOR YOU? Xarelto was prescribed to treat blood clots that may have been found in the veins of your legs (deep vein thrombosis) or in your lungs (pulmonary embolism) and to reduce the risk of them occurring again.  What do you need to know about Xarelto? 20 mg tablet taken ONCE A DAY with your evening meal.  DO NOT stop taking Xarelto without talking to the health care provider who prescribed the medication.  Refill your prescription for 20 mg tablets before you run out.  After discharge, you should have regular check-up appointments with your healthcare provider that is prescribing your Xarelto.  In the future your dose may need to be changed if your kidney function changes by a significant amount.  What do you do if you miss a dose?  If you are taking Xarelto ONCE DAILY and you miss a dose, take it as soon as you remember on the same day then continue your regularly scheduled once daily regimen the next day. Do not take two doses of Xarelto at the same time.   Important Safety Information Xarelto is a blood thinner medicine that can cause bleeding. You should call your healthcare provider right away if you experience any of the following: ? Bleeding from an injury or your nose that does not stop. ? Unusual colored urine (red or dark brown) or unusual colored stools (red or black). ? Unusual bruising for unknown reasons. ? A serious fall or if you hit your head (even if there is no bleeding).  Some medicines may interact with Xarelto and might increase your risk of bleeding while on Xarelto. To help avoid this, consult your healthcare provider or pharmacist prior to using any new prescription or non-prescription medications, including herbals, vitamins, non-steroidal anti-inflammatory drugs (NSAIDs) and  supplements.  This website has more information on Xarelto: [email or web address].xarelto.com.

## 2016-05-15 NOTE — Progress Notes (Signed)
Steve Chaney to be D/C'd Home per MD order.  Discussed with the patient and all questions fully answered.  VSS, Skin clean, dry and intact without evidence of skin break down, no evidence of skin tears noted. IV catheter discontinued intact. Site without signs and symptoms of complications. Dressing and pressure applied.  An After Visit Summary was printed and given to the patient. Patient received prescription.  D/c education completed with patient/family including follow up instructions, medication list, d/c activities limitations if indicated, with other d/c instructions as indicated by MD - patient able to verbalize understanding, all questions fully answered.   Patient instructed to return to ED, call 911, or call MD for any changes in condition.   Patient escorted via WC, and D/C home via private auto.  Harvie HeckMelanie Zamzam Whinery 05/15/2016 4:22 PM

## 2016-05-18 LAB — SURGICAL PATHOLOGY

## 2016-05-21 ENCOUNTER — Ambulatory Visit (INDEPENDENT_AMBULATORY_CARE_PROVIDER_SITE_OTHER): Payer: 59 | Admitting: Surgery

## 2016-05-21 ENCOUNTER — Encounter: Payer: Self-pay | Admitting: Surgery

## 2016-05-21 VITALS — BP 137/65 | HR 77 | Temp 97.7°F | Ht 74.0 in | Wt 126.0 lb

## 2016-05-21 DIAGNOSIS — K4030 Unilateral inguinal hernia, with obstruction, without gangrene, not specified as recurrent: Secondary | ICD-10-CM

## 2016-05-21 NOTE — Progress Notes (Signed)
Outpatient postop visit  05/21/2016  Steve Chaney is an 60 y.o. male.    Procedure: Repair of incarcerated right inguinal hernia  CC: Minimal pain  HPI: This patient 6 days status post repair of an incarcerated inguinal hernia on the right side. No mesh was placed. He states that he has no pain and is not taking his narcotics any longer. He feels well and wants to go back to work but does heavy lifting at the [employer].  Medications reviewed.    Physical Exam:  BP 137/65   Pulse 77   Temp 97.7 F (36.5 C) (Oral)   Ht 6\' 2"  (1.88 m)   Wt 126 lb (57.2 kg)   BMI 16.18 kg/m     PE: Wound is clean with moderate ecchymosis but no erythema no drainage nontender staples are in place    Assessment/Plan:  Patient is only 6 days postop therefore I like him to come back next week for staple removal. I reminded him of his high risk of recurrence because no mesh was utilized and the signs of recurrence and that we could repair it laparoscopically in the future should it recur. I also discussed with him the need to refrain from any heavy lifting for a total 4 weeks after surgery. He understood and will follow-up next week for staple removal  Lattie Hawichard E Remijio Holleran, MD, FACS

## 2016-05-21 NOTE — Patient Instructions (Signed)
Please refer below for your staple removal date.  Please call our office if you have any questions or concerns.

## 2016-05-28 ENCOUNTER — Ambulatory Visit (INDEPENDENT_AMBULATORY_CARE_PROVIDER_SITE_OTHER): Payer: 59 | Admitting: Surgery

## 2016-05-28 ENCOUNTER — Encounter: Payer: Self-pay | Admitting: Surgery

## 2016-05-28 VITALS — BP 115/69 | HR 72 | Temp 97.9°F | Ht 74.0 in | Wt 125.5 lb

## 2016-05-28 DIAGNOSIS — Z8719 Personal history of other diseases of the digestive system: Secondary | ICD-10-CM

## 2016-05-28 DIAGNOSIS — Z9889 Other specified postprocedural states: Secondary | ICD-10-CM

## 2016-05-28 DIAGNOSIS — K4030 Unilateral inguinal hernia, with obstruction, without gangrene, not specified as recurrent: Secondary | ICD-10-CM

## 2016-05-28 NOTE — Progress Notes (Signed)
05/28/2016  HPI: Patient is status post right incarcerated inguinal hernia repair on 11/3 with Dr. Excell Seltzerooper. 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  He was seen in the office on 11/9 at which time he was doing well and returns today for a second wound check and for staple removal. He denies having any recurrence of hernia or new bulging over the right groin. He reports that he had mild serosanguineous drainage from the lateral portion of the incision yesterday but otherwise the wound has been doing well and healing well.  Vital signs: BP 115/69   Pulse 72   Temp 97.9 F (36.6 C) (Oral)   Ht 6\' 2"  (1.88 m)   Wt 56.9 kg (125 lb 8 oz)   BMI 16.11 kg/m    Physical Exam: Constitutional: No acute distress Abdomen: Soft, nondistended, nontender to palpation. Right groin incision is clean dry and intact with staples. No evidence of recurrence or infection. No drainage.  Assessment/Plan: 60 year old male status post right incarcerated inguinal hernia repair on 11/3.  -Patient is aware that he still has 2 more weeks of no heavy lifting restriction but he may return to work with lighter duties. -Cherlynn PoloStaples will be removed today and change to Steri-Strips. -Patient may follow-up on an as-needed basis. Instructions have been given regarding signs and symptoms to look out for including hernia recurrence wound infection or drainage or other concerns.   Howie IllJose Luis Donnavan Covault, MD [facility]

## 2016-05-28 NOTE — Patient Instructions (Signed)
We have removed your staples today. We have applied  steri-strips and they will begin to fall off in 7-10 days. Please call our office if you have questions or concerns.

## 2016-06-08 ENCOUNTER — Ambulatory Visit: Payer: BLUE CROSS/BLUE SHIELD | Admitting: Pharmacy Technician

## 2016-06-08 DIAGNOSIS — Z79899 Other long term (current) drug therapy: Secondary | ICD-10-CM

## 2016-06-09 ENCOUNTER — Ambulatory Visit: Payer: BLUE CROSS/BLUE SHIELD

## 2016-06-10 NOTE — Progress Notes (Signed)
Met with patient completed financial assistance application for [facility] due to recent ED visit.  Patient agreed to be responsible for gathering financial information and forwarding to appropriate department in [facility].    Completed [facility] application and contract.  Patient agreed to all terms of the [facility] contract.  Patient to provide notarized letter of support and 2016 taxes.   Referred patient to [facility] and [employer].  Patient denied having an problem with depression.  Patient stated that he did not need a mental health provider.    Provided patient with Civil engineer, contracting based on his particular needs.    Breo Ellipta, Ventolin and Xarelto Applications completed with patient.  Will forward to Dr. Park Liter once proof of income information received from patient.  Upon receipt of signed application from provider and proof of income from patient, Breo Ellipta & Ventolin Applications will be submitted to [facility] and Xarelto Application will be submitted to [employer].   [facility]

## 2016-06-25 ENCOUNTER — Other Ambulatory Visit: Payer: Self-pay | Admitting: Family Medicine

## 2016-06-25 MED ORDER — RIVAROXABAN 20 MG PO TABS: 20.0000 mg | ORAL_TABLET | Freq: Every day | ORAL | 11 refills | Status: DC

## 2016-06-25 NOTE — Telephone Encounter (Signed)
Pt called needs a RX for Xarelto [facility] [place]. Pt is completely out. Thanks.

## 2016-06-25 NOTE — Telephone Encounter (Signed)
Forward to provider

## 2016-07-14 ENCOUNTER — Telehealth: Payer: Self-pay | Admitting: Pharmacy Technician

## 2016-07-14 NOTE — Telephone Encounter (Signed)
Patient failed to provide current utility bill, 2016 tax return, and notarized letter of support.  No additional medication assistance will be provided by [facility] without the required proof of income documentation.  Patient notified by letter.  Shay Bartoli J. Daphane Odekirk Care Manager [facility] 

## 2016-10-03 ENCOUNTER — Emergency Department
Admission: EM | Admit: 2016-10-03 | Discharge: 2016-10-03 | Disposition: A | Discharge status: Home / Self Care | Payer: Worker's Compensation | Attending: Emergency Medicine | Admitting: Emergency Medicine

## 2016-10-03 ENCOUNTER — Emergency Department: Payer: Worker's Compensation

## 2016-10-03 ENCOUNTER — Encounter: Payer: Self-pay | Admitting: Emergency Medicine

## 2016-10-03 DIAGNOSIS — Y999 Unspecified external cause status: Secondary | ICD-10-CM | POA: Insufficient documentation

## 2016-10-03 DIAGNOSIS — S3992XA Unspecified injury of lower back, initial encounter: Secondary | ICD-10-CM | POA: Diagnosis present

## 2016-10-03 DIAGNOSIS — Y92511 Restaurant or cafe as the place of occurrence of the external cause: Secondary | ICD-10-CM | POA: Diagnosis not present

## 2016-10-03 DIAGNOSIS — M546 Pain in thoracic spine: Secondary | ICD-10-CM | POA: Insufficient documentation

## 2016-10-03 DIAGNOSIS — W1839XA Other fall on same level, initial encounter: Secondary | ICD-10-CM | POA: Diagnosis not present

## 2016-10-03 DIAGNOSIS — M545 Low back pain: Secondary | ICD-10-CM | POA: Diagnosis not present

## 2016-10-03 DIAGNOSIS — Y939 Activity, unspecified: Secondary | ICD-10-CM | POA: Diagnosis not present

## 2016-10-03 DIAGNOSIS — R52 Pain, unspecified: Secondary | ICD-10-CM

## 2016-10-03 MED ORDER — CYCLOBENZAPRINE HCL 10 MG PO TABS: 10.0000 mg | ORAL_TABLET | Freq: Three times a day (TID) | ORAL | 0 refills | Status: DC | PRN

## 2016-10-03 MED ORDER — KETOROLAC TROMETHAMINE 60 MG/2ML IM SOLN
30.0000 mg | Freq: Once | INTRAMUSCULAR | Status: AC
  Administered 2016-10-03: 30 mg via INTRAMUSCULAR
  Filled 2016-10-03: qty 2

## 2016-10-03 NOTE — ED Triage Notes (Signed)
Pt arrived via EMS from work at [employer]. 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  Denies any numbness or tingling in extremities.  No prior hx of back pain.

## 2016-10-03 NOTE — Discharge Instructions (Signed)
Return immediately for any numbness, tingling, loss of bladder or bowel control, increase in pain or any other symptom of concern.

## 2016-10-03 NOTE — ED Provider Notes (Signed)
[facility] Emergency Department Provider Note ____________________________________________  Time seen: Approximately 3:08 PM  I have reviewed the triage vital signs and the nursing notes.   HISTORY  Chief Complaint Back Pain    HPI Steve Chaney is a 61 y.o. male who presents to the emergency department for evaluation of mid to lower back pain. 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 He arrived to the ER via EMS and has not had any pain medications since the incident.   Past Medical History:  Diagnosis Date  . Blood clot in vein   . Pulmonary embolism [facility])     Patient Active Problem List   Diagnosis Date Noted  . Status post right inguinal hernia repair 05/28/2016  . Depression 08/05/2015  . COPD (chronic obstructive pulmonary disease) (HCC) 05/13/2015  . 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 Alcohol use 01/09/2014  . 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 History of embolectomy 01/09/2014  . Tobacco use 01/01/2014    Past Surgical History:  Procedure Laterality Date  . blood clot    . INGUINAL HERNIA REPAIR Right 05/15/2016   Procedure: HERNIA REPAIR INGUINAL INCARCERATED;  Surgeon: Lattie Haw, MD;  Location: [facility] ORS;  Service: General;  Laterality: Right;  . TONSILLECTOMY      Prior to Admission medications   Medication Sig Start Date End Date Taking? Authorizing Provider  albuterol (PROVENTIL HFA;VENTOLIN HFA) 108 (90 BASE) MCG/ACT inhaler Inhale 2 puffs into the lungs every 6 (six) hours as needed for wheezing or shortness of breath. 05/13/15   Megan P Johnson, DO  aspirin 81 MG chewable tablet  Chew 81 mg by mouth daily.    Historical Provider, MD  cyclobenzaprine (FLEXERIL) 10 MG tablet Take 1 tablet (10 mg total) by mouth 3 (three) times daily as needed for muscle spasms. 10/03/16   Jakevion Arney B Adyn Hoes, FNP  fluticasone furoate-vilanterol (BREO ELLIPTA) 100-25 MCG/INH AEPB Inhale 1 puff into the lungs daily. 11/25/15   Gabriel Cirri, NP  rivaroxaban (XARELTO) 20 MG TABS tablet Take 1 tablet (20 mg total) by mouth daily with supper. 06/25/16   Megan Holly Bodily, DO    Allergies Patient has no known allergies.  Family History  Problem Relation Age of Onset  . Hyperlipidemia Mother   . Hyperlipidemia Father   . Heart disease Father   . Diabetes Cousin     Social History Social History  Substance Use Topics  . Smoking status: Current Every Day Smoker    Packs/day: 0.25    Types: Cigarettes  . Smokeless tobacco: Never Used  . Alcohol use 3.6 oz/week    6 Cans of beer per week     Comment: Drinks socially    Review of Systems Constitutional: No recent illness. Cardiovascular: Denies chest pain or palpitations. Respiratory: Denies shortness of breath. Musculoskeletal: Pain in thorax and lumbar spine. Skin: Negative for rash, wound, lesion. Neurological: Negative for focal weakness or numbness.  ____________________________________________   PHYSICAL EXAM:  VITAL SIGNS: ED Triage Vitals  Enc Vitals Group     BP 10/03/16 1411 (!) 113/91  Pulse Rate 10/03/16 1411 66     Resp 10/03/16 1411 (!) 22     Temp 10/03/16 1411 98.1 F (36.7 C)     Temp src --      SpO2 10/03/16 1411 100 %     Weight 10/03/16 1412 150 lb (68 kg)     Height 10/03/16 1412 6' (1.829 m)     Head Circumference --      Peak Flow --      Pain Score 10/03/16 1412 10     Pain Loc --      Pain Edu? --      Excl. in GC? --     Constitutional: Alert and oriented. Well appearing and in no acute distress. Eyes: Conjunctivae are normal. EOMI. Head: Atraumatic. Neck: No stridor.  Respiratory:  Normal respiratory effort.   Musculoskeletal: Mild midline tenderness diffuse over the lower thoracic and upper lumbar area that extends to the paraspinal muscles. Observed change of position in bed without difficulty or assistance. Observed ambulation without assistance or antalgic gait.  Neurologic:  Normal speech and language. No gross focal neurologic deficits are appreciated. Speech is normal. No gait instability. Skin:  Skin is warm, dry and intact. Atraumatic. Psychiatric: Mood and affect are normal. Speech and behavior are normal.  ____________________________________________   LABS (all labs ordered are listed, but only abnormal results are displayed)  Labs Reviewed - No data to display ____________________________________________  RADIOLOGY Thoracic Spine DG:  Diffuse osteopenia. There is mild compression deformity upper endplate probable T6 vertebral body mid thoracic spine. Mild compression deformity probable T10 vertebral body and T12 vertebral body. This are of indeterminate age. Clinical correlation is necessary. If recent fracture is suspected further correlation with MRI is recommended.  Lumbar Spine DG:  Age-indeterminate compression fracture of the L1 vertebral body with approximately 50% height loss.  Age-indeterminate compression deformity of the superior L2 endplate.  Lower lumbar spine degenerative disc disease.  Aortic atherosclerosis.  ____________________________________________   PROCEDURES  Procedure(s) performed: None  ____________________________________________   INITIAL IMPRESSION / ASSESSMENT AND PLAN / ED COURSE  61 year old male presenting to the emergency department for evaluation after sudden onset of back pain after assisting a customer. DG images of the thoracic and lumbar spine showing multi level age indeterminate compression fractures. Exam most consistent with old injuries, however because he has no history of specific injury  or back pain, a MRI was recommended while here. Patient declined the MRI and states that he has an obligation this evening that he is unable to miss. He was given the risks of permanent injury and possibility of paralysis if fractures are more extensive than able to be evaluated on plain images. He states that he understands these risks and again declines staying for an MRI. He was advised that he will need to sign out AGAINST MEDICAL ADVICE to which he agreed to do. Strict return precautions were discussed as well as written on discharge paperwork. He was strongly advised to call and schedule a follow-up appointment with neurosurgery for further treatment and evaluation.  Pertinent labs & imaging results that were available during my care of the patient were reviewed by me and considered in my medical decision making (see chart for details).  _________________________________________   FINAL CLINICAL IMPRESSION(S) / ED DIAGNOSES  Final diagnoses:  Acute pain  Midline thoracic back pain, unspecified chronicity    Discharge Medication List as of 10/03/2016  4:34 PM    START taking these medications  Details  cyclobenzaprine (FLEXERIL) 10 MG tablet Take 1 tablet (10 mg total) by mouth 3 (three) times daily as needed for muscle spasms., Starting Sat 10/03/2016, Print        If controlled substance prescribed during this visit, 12 month history viewed on the NCCS[facility] prior to issuing an initial prescription for Schedule II or III opiod.    Chinita PesterCari B Dorthey Depace, FNP 10/03/16 Rickey [id number]    Sharman CheekPhillip Stafford, MD 10/04/16 [phone]

## 2016-10-03 NOTE — ED Notes (Signed)
Pt signed out ama

## 2016-10-12 ENCOUNTER — Encounter: Payer: Self-pay | Admitting: Family Medicine

## 2016-10-12 ENCOUNTER — Ambulatory Visit (INDEPENDENT_AMBULATORY_CARE_PROVIDER_SITE_OTHER): Payer: Self-pay | Admitting: Family Medicine

## 2016-10-12 VITALS — BP 104/84 | HR 89 | Temp 98.3°F | Resp 17 | Ht 72.0 in | Wt 130.0 lb

## 2016-10-12 DIAGNOSIS — M545 Low back pain, unspecified: Secondary | ICD-10-CM

## 2016-10-12 NOTE — Patient Instructions (Addendum)

## 2016-10-12 NOTE — Progress Notes (Signed)
BP 104/84 (BP Location: Left Arm, Patient Position: Sitting, Cuff Size: Normal)   Pulse 89   Temp 98.3 F (36.8 C) (Oral)   Resp 17   Ht 6' (1.829 m)   Wt 130 lb (59 kg)   SpO2 97%   BMI 17.63 kg/m    Subjective:    Patient ID: Steve Chaney, male    DOB: 05-04-1956, 61 y.o.   MRN: [id number]  HPI: Steve Chaney is a 61 y.o. male  Chief Complaint  Patient presents with  . Back Injury    Onset 2 weeks ago helpinng lift a customer at work ([employer]) Needs paperwork to go back to work   Hurt his back about 2 weeks ago. He went to ER and got taken care of and is feeling 100% better. 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 He threw his back out. He went to the ER and is much better now. He is moving well and feeling well. He just needs a note for work saying he can go back. No other concerns or complaints at this time.   Relevant past medical, surgical, family and social history reviewed and updated as indicated. Interim medical history since our last visit reviewed. Allergies and medications reviewed and updated.  Review of Systems  Constitutional: Negative.   Respiratory: Negative.   Cardiovascular: Negative.   Musculoskeletal: Negative.   Psychiatric/Behavioral: Negative.     Per HPI unless specifically indicated above     Objective:    BP 104/84 (BP Location: Left Arm, Patient Position: Sitting, Cuff Size: Normal)   Pulse 89   Temp 98.3 F (36.8 C) (Oral)   Resp 17   Ht 6' (1.829 m)   Wt 130 lb (59 kg)   SpO2 97%   BMI 17.63 kg/m   Wt Readings from Last 3 Encounters:  10/12/16 130 lb (59 kg)  10/03/16 150 lb (68 kg)  05/28/16 125 lb 8 oz (56.9 kg)    Physical Exam  Constitutional: He is oriented to person, place, and time. He appears well-developed and well-nourished. No distress.  HENT:  Head: Normocephalic and atraumatic.  Right Ear: Hearing normal.  Left  Ear: Hearing normal.  Nose: Nose normal.  Eyes: Conjunctivae and lids are normal. Right eye exhibits no discharge. Left eye exhibits no discharge. No scleral icterus.  Pulmonary/Chest: Effort normal. No respiratory distress.  Neurological: He is alert and oriented to person, place, and time.  Skin: Skin is warm, dry and intact. No rash noted. No erythema. No pallor.  Psychiatric: He has a normal mood and affect. His speech is normal and behavior is normal. Judgment and thought content normal. Cognition and memory are normal.  Nursing note and vitals reviewed. Back Exam:    Inspection:  Normal spinal curvature.  No deformity, ecchymosis, erythema, or lesions     Palpation:     Midline spinal tenderness: no      Paralumbar tenderness: no      Parathoracic tenderness: no      Buttocks tenderness: no     Range of Motion:      Flexion: Normal     Extension:Normal     Lateral bending:Normal    Rotation:Normal    Neuro Exam:Lower extremity DTRs normal & symmetric.  Strength and sensation intact.    Special Tests:      Straight leg raise:negative  Assessment & Plan:   Problem List Items Addressed This Visit    None    Visit Diagnoses    Acute bilateral low back pain without sciatica    -  Primary   Resolved. Continue stretches. Call with any concerns.        Follow up plan: Return if symptoms worsen or fail to improve.

## 2017-04-28 ENCOUNTER — Telehealth: Payer: Self-pay

## 2017-04-28 NOTE — Telephone Encounter (Signed)
Left message for patient that his medication assistance paperwork has been completed. He needs to pick them up to complete his part. Paperwork is on my desk.

## 2017-07-28 ENCOUNTER — Telehealth: Payer: Self-pay | Admitting: Family Medicine

## 2017-07-28 NOTE — Telephone Encounter (Signed)
Copied from CRM [id number]. Topic: General - Other >> Jul 28, 2017  4:05 PM Viviann SpareWhite, Selina wrote: Reason for CRM: Patient called requesting that a completed brand name RX for his rivaroxaban (XARELTO) 20 MG TABS with the doctor sign & date and the provider tax id number [facility], per their request. Patient can be reached @ [phone] for any questions.

## 2017-07-29 MED ORDER — RIVAROXABAN 20 MG PO TABS: 20.0000 mg | ORAL_TABLET | Freq: Every day | ORAL | 11 refills | Status: DC

## 2017-07-29 NOTE — Telephone Encounter (Signed)
Rx printed

## 2017-07-29 NOTE — Telephone Encounter (Signed)
[facility] Phone: [phone] Fax: [phone]  Prescription Faxed

## 2017-08-03 NOTE — Telephone Encounter (Signed)
Pt said he just got off the phone with Laural Benesjohnson and [facility]ural [facility] and they told him that it was not signed and dated. It also needed to be filled out on the medication number 1 box completely. Please re fax. [phone]. He wants this done today, please. He has had his blood thinner for almost a month, he is worried

## 2017-08-05 NOTE — Telephone Encounter (Signed)
Called and left a message asking patient to have [facility]ural [facility] refax the paperwork.

## 2017-08-09 NOTE — Telephone Encounter (Signed)
Patient will bring in a copy of the form.

## 2017-08-20 ENCOUNTER — Other Ambulatory Visit: Payer: Self-pay | Admitting: Family Medicine

## 2018-07-19 ENCOUNTER — Telehealth: Payer: Self-pay

## 2018-07-19 NOTE — Telephone Encounter (Signed)
Called patient, no answer. Left VM letting him know that Fleet ContrasRachel says pt needs to be seen first so she can fill out the paper work for "Patient Assistance Program Application" and asked patient to return call to the office.

## 2018-07-19 NOTE — Telephone Encounter (Signed)
Form is filed between [employer] and [employer] desk.

## 2018-07-20 NOTE — Telephone Encounter (Signed)
Patient scheduled an appointment for 08/01/2018 at 3:30 pm with Dr. Laural Benes.

## 2018-08-01 ENCOUNTER — Ambulatory Visit: Payer: Self-pay | Admitting: Family Medicine

## 2018-08-08 ENCOUNTER — Ambulatory Visit: Payer: Self-pay | Admitting: Family Medicine

## 2018-08-08 DIAGNOSIS — Z86711 Personal history of pulmonary embolism: Secondary | ICD-10-CM | POA: Insufficient documentation

## 2018-08-08 NOTE — Progress Notes (Deleted)
There were no vitals taken for this visit.   Subjective:    Patient ID: Steve Chaney, male    DOB: 1955-09-28, 63 y.o.   MRN: [id number]  HPI: Steve Chaney is a 62 y.o. male who presents today for patient assistance form help. He has not been seen for a regular appointment since 07/2015. He was seen for a sick visit in 2018, but did not follow up after that.   No chief complaint on file.  Had a PE and extensive B/L LE thrombi in 2014. Has been maintained on xarelto since then. Had an appointment with hematology in 2016 who recommended long-term anticoagulation. He did not have his recommended blood work done with hematology in 2016. He has been taking his xarelto daily without issue with the help of patient-assistance program.    Relevant past medical, surgical, family and social history reviewed and updated as indicated. Interim medical history since our last visit reviewed. Allergies and medications reviewed and updated.  Review of Systems  Per HPI unless specifically indicated above     Objective:    There were no vitals taken for this visit.  Wt Readings from Last 3 Encounters:  10/12/16 130 lb (59 kg)  10/03/16 150 lb (68 kg)  05/28/16 125 lb 8 oz (56.9 kg)    Physical Exam  Results for orders placed or performed during the hospital encounter of 05/14/16  Surgical pcr screen  Result Value Ref Range   MRSA, PCR NEGATIVE NEGATIVE   Staphylococcus aureus NEGATIVE NEGATIVE  CBC  Result Value Ref Range   WBC 12.0 (H) 3.8 - 10.6 K/uL   RBC 5.65 4.40 - 5.90 MIL/uL   Hemoglobin 17.7 13.0 - 18.0 g/dL   HCT 33.8 25.0 - 53.9 %   MCV 90.9 80.0 - 100.0 fL   MCH 31.3 26.0 - 34.0 pg   MCHC 34.4 32.0 - 36.0 g/dL   RDW 76.7 (H) 34.1 - 93.7 %   Platelets 374 150 - 440 K/uL  Basic metabolic panel  Result Value Ref Range   Sodium 134 (L) 135 - 145 mmol/L   Potassium 4.1 3.5 - 5.1 mmol/L   Chloride 98 (L) 101 - 111 mmol/L   CO2 27 22 - 32 mmol/L   Glucose, Bld 118 (H) 65 -  99 mg/dL   BUN 9 6 - 20 mg/dL   Creatinine, Ser 9.02 0.61 - 1.24 mg/dL   Calcium 9.1 8.9 - 40.9 mg/dL   GFR calc non Af Amer >60 >60 mL/min   GFR calc Af Amer >60 >60 mL/min   Anion gap 9 5 - 15  Surgical pathology  Result Value Ref Range   SURGICAL PATHOLOGY      Surgical Pathology CASE: [id number] PATIENT: [name] Surgical Pathology Report     SPECIMEN SUBMITTED: A. Omentum B. Hernia sac, right inguinal  CLINICAL HISTORY: None provided  PRE-OPERATIVE DIAGNOSIS: Right incarcerated inguinal hernia  POST-OPERATIVE DIAGNOSIS: Same as pre-op     DIAGNOSIS: A. OMENTUM; OMENTECTOMY: - MATURE ADIPOSE TISSUE.  B. HERNIA SAC, RIGHT INGUINAL; REPAIR: - MESOTHELIAL LINED FIBROADIPOSE TISSUE, CONSISTENT WITH HERNIA SAC.   GROSS DESCRIPTION:  A. Labeled: omentum  Tissue fragment(s): 1  Size: 15.8 x 15.5 x 2.5 cm  Description: unoriented fragment of yellow lobulated fibrofatty tissue, section, yellow lobulated fibrofatty without masses  Representative submitted in 1 Cassette(s).   B. Labeled: right inguinal hernia sac  Tissue fragment(s): 1  Size: 8.7 x 3.6 x 1.1 cm  Description: purple tan  fibrous blind ended pouch, surface wrinkled with no excrescent  Representative submitted in 1 cassette(s) .    Final Diagnosis performed by Glenice Bowana Baker, MD.  Electronically signed 05/18/2016 11:13:37AM    The electronic signature indicates that the named Attending Pathologist has evaluated the specimen  Technical component performed at [facility], [address], [place], [place] [address] Lab: [phone] Dir: Titus DubinWilliam F. Cato MulliganHancock, MD  Professional component performed at [facility], [facility], [address], [place], [place] [address] Lab: [phone] Dir: Georgiann Cockerara C. 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

## 2018-08-19 ENCOUNTER — Emergency Department
Admission: EM | Admit: 2018-08-19 | Discharge: 2018-08-19 | Discharge status: Against Medical Advice | Payer: BLUE CROSS/BLUE SHIELD | Attending: Emergency Medicine | Admitting: Emergency Medicine

## 2018-08-19 ENCOUNTER — Emergency Department: Payer: BLUE CROSS/BLUE SHIELD

## 2018-08-19 ENCOUNTER — Ambulatory Visit: Payer: Self-pay | Admitting: Nurse Practitioner

## 2018-08-19 ENCOUNTER — Encounter: Payer: Self-pay | Admitting: Emergency Medicine

## 2018-08-19 ENCOUNTER — Ambulatory Visit: Payer: Self-pay

## 2018-08-19 DIAGNOSIS — Z79899 Other long term (current) drug therapy: Secondary | ICD-10-CM | POA: Insufficient documentation

## 2018-08-19 DIAGNOSIS — X501XXA Overexertion from prolonged static or awkward postures, initial encounter: Secondary | ICD-10-CM | POA: Insufficient documentation

## 2018-08-19 DIAGNOSIS — S22069A Unspecified fracture of T7-T8 vertebra, initial encounter for closed fracture: Secondary | ICD-10-CM | POA: Insufficient documentation

## 2018-08-19 DIAGNOSIS — S22000A Wedge compression fracture of unspecified thoracic vertebra, initial encounter for closed fracture: Secondary | ICD-10-CM

## 2018-08-19 DIAGNOSIS — Y939 Activity, unspecified: Secondary | ICD-10-CM | POA: Insufficient documentation

## 2018-08-19 DIAGNOSIS — J449 Chronic obstructive pulmonary disease, unspecified: Secondary | ICD-10-CM | POA: Insufficient documentation

## 2018-08-19 DIAGNOSIS — Z7982 Long term (current) use of aspirin: Secondary | ICD-10-CM | POA: Insufficient documentation

## 2018-08-19 DIAGNOSIS — Y999 Unspecified external cause status: Secondary | ICD-10-CM | POA: Insufficient documentation

## 2018-08-19 DIAGNOSIS — M549 Dorsalgia, unspecified: Secondary | ICD-10-CM

## 2018-08-19 DIAGNOSIS — Y92000 Kitchen of unspecified non-institutional (private) residence as  the place of occurrence of the external cause: Secondary | ICD-10-CM | POA: Insufficient documentation

## 2018-08-19 DIAGNOSIS — F17210 Nicotine dependence, cigarettes, uncomplicated: Secondary | ICD-10-CM | POA: Insufficient documentation

## 2018-08-19 MED ORDER — CYCLOBENZAPRINE HCL 5 MG PO TABS: ORAL_TABLET | ORAL | 0 refills | Status: DC

## 2018-08-19 MED ORDER — CYCLOBENZAPRINE HCL 10 MG PO TABS
5.0000 mg | ORAL_TABLET | Freq: Once | ORAL | Status: AC
  Administered 2018-08-19: 5 mg via ORAL
  Filled 2018-08-19: qty 1

## 2018-08-19 NOTE — Discharge Instructions (Addendum)
Your back x-ray shows multiple fractures that may be new.  These fractures may be serious and could cause you to be paralyzed if not treated.  I recommend that you stay for an MRI to further evaluate these fractures.  Please return to the ER tonight for MRI.

## 2018-08-19 NOTE — ED Triage Notes (Signed)
Pt states that the pain is under his left shoulder but has full ROM. Pt reports then pain hits when he twists certain ways it send bolts of pain.

## 2018-08-19 NOTE — ED Provider Notes (Signed)
[facility] Emergency Department Provider Note  ____________________________________________  Time seen: Approximately 5:52 PM  I have reviewed the triage vital signs and the nursing notes.   HISTORY  Chief Complaint Back Pain    HPI Leonarda SalonCharles Handel is a 63 y.o. male presents emergency department for evaluation of left mid back pain for 1 week.  Patient states that he was twisting wrong in the kitchen when he felt a sharp pain in his back.  No specific trauma.  Pain is worse over his scapula when he moves his left arm.  Pain does not radiate.  He has not been able to "work out the pain" since incident.  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  Patient was talking to his daughter, who works in physical therapy and is concerned that he pulled a muscle or pinched a nerve.  No bowel or bladder dysfunction or saddle anesthesias.  No shortness of breath, chest pain, numbness, tingling.   Past Medical History:  Diagnosis Date  . Blood clot in vein   . Pulmonary embolism [facility](HCC)     Patient Active Problem List   Diagnosis Date Noted  . History of pulmonary embolus (PE) 08/08/2018  . Status post right inguinal hernia repair 05/28/2016  . Depression 08/05/2015  . COPD (chronic obstructive pulmonary disease) (HCC) 05/13/2015  . 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 Alcohol use 01/09/2014  . 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 History of embolectomy 01/09/2014  . Tobacco use 01/01/2014    Past Surgical History:  Procedure Laterality Date  . blood clot    . INGUINAL HERNIA REPAIR Right 05/15/2016   Procedure: HERNIA REPAIR INGUINAL INCARCERATED;  Surgeon: Lattie Hawichard E Cooper, MD;  Location: [facility] ORS;  Service: General;  Laterality: Right;  . TONSILLECTOMY      Prior to Admission medications   Medication Sig Start Date End Date Taking? Authorizing Provider  albuterol (PROVENTIL HFA;VENTOLIN HFA) 108 (90  BASE) MCG/ACT inhaler Inhale 2 puffs into the lungs every 6 (six) hours as needed for wheezing or shortness of breath. 05/13/15   Olevia PerchesJohnson, Megan P, DO  aspirin 81 MG chewable tablet Chew 81 mg by mouth daily.    [provider]  cyclobenzaprine (FLEXERIL) 5 MG tablet Take 1-2 tablets 3 times daily as needed 08/19/18   Enid DerryWagner, Jaycelynn Knickerbocker, PA-C  fluticasone furoate-vilanterol (BREO ELLIPTA) 100-25 MCG/INH AEPB Inhale 1 puff into the lungs daily. 11/25/15   Gabriel CirriWicker, Cheryl, NP  rivaroxaban (XARELTO) 20 MG TABS tablet Take 1 tablet (20 mg total) by mouth daily with supper. BRAND NAME NECESSARY 07/29/17   Johnson, Megan P, DO  XARELTO 20 MG TABS tablet TAKE 1 TABLET(20 MG) BY MOUTH DAILY WITH SUPPER 08/20/17   Olevia PerchesJohnson, Megan P, DO    Allergies Patient has no known allergies.  Family History  Problem Relation Age of Onset  . Hyperlipidemia Mother   . Hyperlipidemia Father   . Heart disease Father   . Diabetes Cousin     Social History Social History   Tobacco Use  . Smoking status: Current Every Day Smoker    Packs/day: 0.25    Types: Cigarettes  . Smokeless tobacco: Never Used  Substance Use Topics  . Alcohol use: Yes    Alcohol/week: 6.0 standard drinks    Types: 6 Cans of beer per week    Comment: Drinks socially  . Drug use: No     Review of Systems  Cardiovascular: No chest pain. Respiratory: No cough. No SOB. Gastrointestinal: No abdominal pain.  No nausea, no vomiting.  Musculoskeletal: Positive back pain. Skin: Negative for rash, abrasions, lacerations, ecchymosis. Neurological: Negative for headaches, numbness or tingling   ____________________________________________   PHYSICAL EXAM:  VITAL SIGNS: ED Triage Vitals  Enc Vitals Group     BP 08/19/18 1632 117/74     Pulse Rate 08/19/18 1632 83     Resp 08/19/18 1632 20     Temp 08/19/18 1632 98.7 F (37.1 C)     Temp Source 08/19/18 1632 Oral     SpO2 08/19/18 1632 96 %     Weight 08/19/18 1634 158 lb (71.7  kg)     Height 08/19/18 1634 6\' 2"  (1.88 m)     Head Circumference --      Peak Flow --      Pain Score 08/19/18 1633 10     Pain Loc --      Pain Edu? --      Excl. in GC? --      Constitutional: Alert and oriented. Well appearing and in no acute distress. Eyes: Conjunctivae are normal. PERRL. EOMI. Head: Atraumatic. ENT:      Ears:      Nose: No congestion/rhinnorhea.      Mouth/Throat: Mucous membranes are moist.  Neck: No stridor. Cardiovascular: Normal rate, regular rhythm.  Good peripheral circulation. Respiratory: Normal respiratory effort without tachypnea or retractions. Lungs CTAB. Good air entry to the bases with no decreased or absent breath sounds. Gastrointestinal: Bowel sounds 4 quadrants. Soft and nontender to palpation. No guarding or rigidity. No palpable masses. No distention.  Musculoskeletal: Full range of motion to all extremities. No gross deformities appreciated.  Pinpoint area of tenderness to left mid to scapula next to thoracic spine. Normal gait.  Strength equal in his upper and lower extremities bilaterally. Neurologic:  Normal speech and language. No gross focal neurologic deficits are appreciated.  Skin:  Skin is warm, dry and intact. No rash noted. Psychiatric: Mood and affect are normal. Speech and behavior are normal. Patient exhibits appropriate insight and judgement.   ____________________________________________   LABS (all labs ordered are listed, but only abnormal results are displayed)  Labs Reviewed - No data to display ____________________________________________  EKG   ____________________________________________  RADIOLOGY Lexine Baton, personally viewed and evaluated these images (plain radiographs) as part of my medical decision making, as well as reviewing the written report by the radiologist.  Dg Chest 2 View  Result Date: 08/19/2018 CLINICAL DATA:  Left mid back pain following a twisting injury. Smoker. EXAM: CHEST -  2 VIEW COMPARISON:  01/14/2006. Thoracic spine radiographs obtained today and on 10/03/2016. FINDINGS: Normal sized heart. Clear lungs. The lungs are hyperexpanded with mild diffuse peribronchial thickening and accentuation of the interstitial markings. Thoracolumbar spine vertebral compression deformities. These will be described separately in the thoracic spine report. IMPRESSION: 1. Thoracolumbar spine vertebral compression deformities, described separately in the thoracic spine radiographs report. 2. Changes of COPD and chronic bronchitis. Electronically Signed   By: Beckie Salts M.D.   On: 08/19/2018 17:45   Dg Thoracic Spine 2 View  Result Date: 08/19/2018 CLINICAL DATA:  Back pain following a twisting injury. Chronic cough. Smoker. EXAM: THORACIC SPINE 2 VIEWS COMPARISON:  10/03/2016. FINDINGS: Twelve rib-bearing thoracic vertebrae. Approximately 20% T6 vertebral compression deformity without significant change and no visible acute fracture lines. Approximately 60% interval T8 vertebral compression deformity with mild bony retropulsion and possible  acute fracture lines. Approximately 25% T10 vertebral compression deformity without significant change. Approximately 60% L1 vertebral compression deformity, not previously included. No visible acute fracture lines. Mild to moderate bony retropulsion superiorly. No subluxations. Diffuse osteopenia. Lower cervical spine degenerative changes. IMPRESSION: 1. Interval approximately 60% T8 vertebral compression deformity with mild bony retropulsion and possible acute fracture lines. 2. Approximately 60% L1 vertebral compression deformity, age indeterminate, with no visible fracture lines. 3. Stable old T6 and T10 vertebral compression deformities. Electronically Signed   By: Beckie SaltsSteven  Reid M.D.   On: 08/19/2018 17:50    ____________________________________________    PROCEDURES  Procedure(s) performed:    Procedures    Medications  cyclobenzaprine  (FLEXERIL) tablet 5 mg (5 mg Oral Given 08/19/18 1840)     ____________________________________________   INITIAL IMPRESSION / ASSESSMENT AND PLAN / ED COURSE  Pertinent labs & imaging results that were available during my care of the patient were reviewed by me and considered in my medical decision making (see chart for details).  Review of the [place] CSRS was performed in accordance of the [facility] prior to dispensing any controlled drugs.   Patient presented to emergency department for evaluation of back pain for 1 week.  Thoracic x-ray is concerning for a 60% compression fracture at T8 and a 60% compression fracture at L1.  Patient's pain is around T8.  I recommend the patient stays for an MRI to further evaluate.  Patient states that he must go home to eat a special meal that cannot be accommodated here in the emergency department.  He states that he will go home, grab dinner, and return to the emergency department for further evaluation.  He elects to leave AGAINST MEDICAL ADVICE.  Patient understands that this fracture could paralyze him.  Extensive conversation with him was had about this injury.     ____________________________________________  FINAL CLINICAL IMPRESSION(S) / ED DIAGNOSES  Final diagnoses:  Mid back pain  Compression fracture of body of thoracic vertebra (HCC)      NEW MEDICATIONS STARTED DURING THIS VISIT:  ED Discharge Orders         Ordered    cyclobenzaprine (FLEXERIL) 5 MG tablet     08/19/18 1833              This chart was dictated using voice recognition software/Dragon. Despite best efforts to proofread, errors can occur which can change the meaning. Any change was purely unintentional.    Enid DerryWagner, Arpi Diebold, PA-C 08/19/18 1846    Don PerkingVeronese, [place], MD 08/19/18 2229

## 2018-08-19 NOTE — Telephone Encounter (Signed)
Pt. Called to report severe pain in the back, beneath the left shoulder blade. Pain is non-radiating.  Reported onset of pain about 2 weeks ago, with a twisting motion of his body.  Was aware at time of twisting movement, that "something wasn't right."   Stated the pain is more intense with movement of arms.  Denied any weakness or numbness in his arms or legs.  Denied anterior chest pain.  Denied shortness of breath, but stated "it is harder to draw-in air due to sharp pain in left back."  Reported it is painful to cough, and that with his COPD, it is hard to cough up mucus.  Denied feeling more congested in chest.  Reported he has driven 700-900 miles over past 2 weeks, visiting grandchildren. Has been applying heat, Acetaminophen and Ibuprofen.  No avail. Appt. With Dr. Laural Benes; appt. Scheduled with Jolene Cannady @ 1:30 PM.  Care advice given per protocol.  Encouraged if symptoms worsen prior to appt., to proceed to ER.  Verb. Understanding.       Reason for Disposition . [1] SEVERE back pain (e.g., excruciating, unable to do any normal activities) AND [2] not improved 2 hours after pain medicine  Answer Assessment - Initial Assessment Questions 1. ONSET: "When did the pain begin?"      About 2 weeks ago ; reported he twisted, and noted the pain  2. LOCATION: "Where does it hurt?" (upper, mid or lower back)     Left back, beneathe the left shoulder blade 3. SEVERITY: "How bad is the pain?"  (e.g., Scale 1-10; mild, moderate, or severe)   - MILD (1-3): doesn't interfere with normal activities    - MODERATE (4-7): interferes with normal activities or awakens from sleep    - SEVERE (8-10): excruciating pain, unable to do any normal activities      10/10 4. PATTERN: "Is the pain constant?" (e.g., yes, no; constant, intermittent)      constant 5. RADIATION: "Does the pain shoot into your legs or elsewhere?"     With movement the pain intensifies 6. CAUSE:  "What do you think is causing the back  pain?"      Thinks it could be pinched nerve  7. BACK OVERUSE:  "Any recent lifting of heavy objects, strenuous work or exercise?"     no 8. MEDICATIONS: "What have you taken so far for the pain?" (e.g., nothing, acetaminophen, NSAIDS)     Use of heat, Acetaminophen, and some use of Ibuprofen  9. NEUROLOGIC SYMPTOMS: "Do you have any weakness, numbness, or problems with bowel/bladder control?"     Denied weakness or numbness in extremities; denied any problems with bowel / bladder control  10. OTHER SYMPTOMS: "Do you have any other symptoms?" (e.g., fever, abdominal pain, burning with urination, blood in urine)       It is painful to take a deep breath ; it is hard to cough to move mucus with his COPD  11. PREGNANCY: "Is there any chance you are pregnant?" (e.g., yes, no; LMP)      N/a  Protocols used: BACK PAIN-A-AH

## 2018-08-19 NOTE — ED Notes (Signed)
See triage note  Presents with back pain  States pain is under left shoulder  States pain started about 1 week ago.

## 2018-08-19 NOTE — ED Triage Notes (Signed)
Pt reports somehow twisted his back. Pt states he has had back pain before and is not sure if it is a pinched nerve or a pulled muscle. Pt states pain for over a week.

## 2018-08-24 ENCOUNTER — Other Ambulatory Visit: Payer: Self-pay

## 2018-08-24 ENCOUNTER — Emergency Department
Admission: EM | Admit: 2018-08-24 | Discharge: 2018-08-24 | Disposition: A | Discharge status: Home / Self Care | Payer: Self-pay | Attending: Emergency Medicine | Admitting: Emergency Medicine

## 2018-08-24 DIAGNOSIS — M546 Pain in thoracic spine: Secondary | ICD-10-CM | POA: Insufficient documentation

## 2018-08-24 DIAGNOSIS — F17210 Nicotine dependence, cigarettes, uncomplicated: Secondary | ICD-10-CM | POA: Insufficient documentation

## 2018-08-24 DIAGNOSIS — J449 Chronic obstructive pulmonary disease, unspecified: Secondary | ICD-10-CM | POA: Insufficient documentation

## 2018-08-24 MED ORDER — ACETAMINOPHEN 500 MG PO TABS
1000.0000 mg | ORAL_TABLET | Freq: Once | ORAL | Status: AC
  Administered 2018-08-24: 1000 mg via ORAL
  Filled 2018-08-24: qty 2

## 2018-08-24 MED ORDER — OXYCODONE-ACETAMINOPHEN 5-325 MG PO TABS
ORAL_TABLET | ORAL | Status: AC
  Administered 2018-08-24: 21:00:00 via ORAL
  Filled 2018-08-24: qty 1

## 2018-08-24 MED ORDER — OXYCODONE HCL 5 MG PO TABS: 5.0000 mg | ORAL_TABLET | Freq: Four times a day (QID) | ORAL | 0 refills | Status: DC | PRN

## 2018-08-24 MED ORDER — OXYCODONE HCL 5 MG PO TABS
5.0000 mg | ORAL_TABLET | Freq: Once | ORAL | Status: AC
  Administered 2018-08-24: 5 mg via ORAL
  Filled 2018-08-24: qty 1

## 2018-08-24 MED ORDER — PREDNISONE 20 MG PO TABS
60.0000 mg | ORAL_TABLET | Freq: Once | ORAL | Status: AC
  Administered 2018-08-24: 60 mg via ORAL
  Filled 2018-08-24: qty 3

## 2018-08-24 MED ORDER — METHYLPREDNISOLONE 4 MG PO TABS: ORAL_TABLET | ORAL | 0 refills | Status: DC

## 2018-08-24 MED ORDER — OXYCODONE-ACETAMINOPHEN 5-325 MG PO TABS
1.0000 | ORAL_TABLET | ORAL | Status: DC | PRN
  Administered 2018-08-24: 1 via ORAL

## 2018-08-24 NOTE — ED Provider Notes (Signed)
[facility] Emergency Department Provider Note  ____________________________________________  Time seen: Approximately 9:46 PM  I have reviewed the triage vital signs and the nursing notes.   HISTORY  Chief Complaint Back Pain    HPI Steve Chaney is a 63 y.o. 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  The patient reports that 1 week ago, he was moving a cart and then made a twisting motion with immediate onset of bilateral lateral back pain in the thoracic area.  This pain was initially mild and has gotten progressively worse.  He has no midline pain, and denies any numbness tingling or weakness.  He has no saddle anesthesia, urinary or fecal incontinence or retention, or difficulty walking.  He has tried a heating pad, which has helped, and ice pack which has made the pain worse.  He states he could "eat a whole bottle of Tylenol and it would not help."  He cannot take NSAID medications due to his Xarelto use.  He denies any urinary symptoms or fever.  The patient did not offer this information to me, but upon reviewing his chart, the patient was seen in the emergency department 08/19/2018.  At that time, he underwent imaging which was suspicious for compression fractures at T8 and L1, as well as stable old T6 and T10 compression deformities.  He was offered MRI evaluation and left AGAINST MEDICAL ADVICE.  He received a prescription for Flexeril.  I have again offered him MRI for further evaluation, and he states "I cannot do it tonight Honey, I will come back tomorrow."    Past Medical History:  Diagnosis Date  . Blood clot in vein   . Pulmonary embolism [facility])     Patient Active Problem List   Diagnosis Date Noted  . History of pulmonary embolus (PE) 08/08/2018  . Status post right inguinal hernia repair 05/28/2016  . Depression 08/05/2015  . COPD (chronic obstructive pulmonary disease) (HCC)  05/13/2015  . 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 Alcohol use 01/09/2014  . 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 History of embolectomy 01/09/2014  . Tobacco use 01/01/2014    Past Surgical History:  Procedure Laterality Date  . blood clot    . INGUINAL HERNIA REPAIR Right 05/15/2016   Procedure: HERNIA REPAIR INGUINAL INCARCERATED;  Surgeon: Lattie Haw, MD;  Location: [facility] ORS;  Service: General;  Laterality: Right;  . TONSILLECTOMY      Current Outpatient Rx  . Order #: [id number] Class: Normal  . Order #: [id number] Class: Historical Med  . Order #: [id number] Class: Normal  . Order #: [id number] Class: Normal  . Order #: [id number] Class: Print  . Order #: [id number] Class: Print  . Order #: [id number] Class: Print  . Order #: [id number] Class: Normal    Allergies Patient has no known allergies.  Family History  Problem Relation Age of Onset  . Hyperlipidemia Mother   . Hyperlipidemia Father   . Heart disease Father   . Diabetes Cousin     Social History Social History   Tobacco Use  . Smoking status: Current Every Day Smoker    Packs/day: 0.25    Types: Cigarettes  . Smokeless tobacco: Never Used  Substance Use Topics  . Alcohol use: Yes    Alcohol/week: 6.0 standard drinks    Types: 6 Cans of beer per week    Comment: Drinks socially  . Drug use: No    Review of Systems Constitutional:  No fever/chills.  No lightheadedness or syncope.  No acute trauma.  Positive acute strain. Eyes: No visual changes. ENT: No sore throat. No congestion or rhinorrhea. Cardiovascular: Denies chest pain. Denies palpitations. Respiratory: Denies shortness of breath.  No cough. Gastrointestinal: No abdominal pain.  No nausea, no vomiting.  No diarrhea.  No constipation. Genitourinary: Negative for dysuria. Musculoskeletal: Positive bilateral thoracic lateral back pain. Skin: Negative for rash. Neurological: Negative for headaches. No focal numbness, tingling or  weakness.  No saddle anesthesia.  No urinary or fecal incontinence or retention.  No difficulty walking.    ____________________________________________   PHYSICAL EXAM:  VITAL SIGNS: ED Triage Vitals  Enc Vitals Group     BP 08/24/18 1922 (!) 146/102     Pulse Rate 08/24/18 1922 (!) 58     Resp 08/24/18 1922 18     Temp 08/24/18 1922 97.7 F (36.5 C)     Temp Source 08/24/18 1922 Oral     SpO2 08/24/18 1922 98 %     Weight 08/24/18 1920 150 lb (68 kg)     Height 08/24/18 1920 6\' 1"  (1.854 m)     Head Circumference --      Peak Flow --      Pain Score 08/24/18 1920 10     Pain Loc --      Pain Edu? --      Excl. in GC? --     Constitutional: Alert and oriented. Answers questions appropriately. Eyes: Conjunctivae are normal.  EOMI. No scleral icterus. Head: Atraumatic. Nose: No congestion/rhinnorhea. Mouth/Throat: Mucous membranes are moist.  Neck: No stridor.  Supple.  No JVD.  No meningismus. Cardiovascular: Normal rate, regular rhythm. No murmurs, rubs or gallops.  Respiratory: Normal respiratory effort.  No accessory muscle use or retractions. Lungs CTAB.  No wheezes, rales or ronchi. Gastrointestinal: Soft, nontender and nondistended.  No guarding or rebound.  No peritoneal signs. Musculoskeletal: No midline C, T or L-spine tenderness.  No focal deformities or step-offs.  No skin changes.  The patient has some reddish discoloration which is likely due to a heating pad on the lower thoracic and upper lumbar back diffusely across the back without any evidence of burns.  He does not have any reproducible tenderness to palpation when I push on his back. Neuro: Alert and oriented x3.  Speech is clear.  The patient's face and smile are symmetric.  The patient has 5 out of 5 quad and hamstring dorsiflexion and plantar flexion strength.  He has normal reflexes in the bilateral patellas.  He has no decrease sensation to light touch in the lower extremities.  He has a normal gait  without ataxia. Skin:  Skin is warm, dry.  See above for skin changes likely related to heating pad. Psychiatric: Mood and affect are normal.  Mildly pressured speech.  ____________________________________________   LABS (all labs ordered are listed, but only abnormal results are displayed)  Labs Reviewed - No data to display ____________________________________________  EKG  Not indicated ____________________________________________  RADIOLOGY  No results found.  ____________________________________________   PROCEDURES  Procedure(s) performed: None  Procedures  Critical Care performed: No ____________________________________________   INITIAL IMPRESSION / ASSESSMENT AND PLAN / ED COURSE  Pertinent labs & imaging results that were available during my care of the patient were reviewed by me and considered in my medical decision making (see chart for details).  63 y.o. male with a history of chronic back pain and chronic fractures re-presenting for bilateral back pain.  On my examination, the patient has no focal neurologic deficits.  In addition, he has no midline back pain.  His pain may be musculoskeletal, or related to compression fractures.  Aortic pathology is considered but unlikely.  I have reviewed his chart, and he does have a history of old fractures, as well as some possibly more acute fractures on an x-ray that was done 2/7.  I have offered him MRI for further evaluation but he is electing to defer this test.  Today, there is no evidence of neurologic deficit, and I have given him a Medrol Dosepak, as well as several tablets of Toradol as his pain medication choices are limited due to his Xarelto use.  I have given him follow-up instructions with Dr. Rosita KeaMenz, the orthopedist locally who has spine expertise.  Red flag symptoms were discussed.  Follow-up instructions and return precautions were discussed.  ____________________________________________  FINAL CLINICAL  IMPRESSION(S) / ED DIAGNOSES  Final diagnoses:  Acute bilateral thoracic back pain         NEW MEDICATIONS STARTED DURING THIS VISIT:  Discharge Medication List as of 08/24/2018  9:45 PM    START taking these medications   Details  methylPREDNISolone (MEDROL) 4 MG tablet Day 1: 8 mg PO before breakfast, 4 mg after lunch and after dinner, and 8 mg at bedtime Day 2: 4 mg PO before breakfast, after lunch, and after dinner and 8 mg at bedtime Day 3: 4 mg PO before breakfast, after lunch, after dinner, and at bedtime Day 4 : 4 mg PO before breakfast, after lunch, and at bedtime Day 5: 4 mg PO before breakfast and at bedtime Day 6: 4 mg PO before breakfast, Print    oxyCODONE (ROXICODONE) 5 MG immediate release tablet Take 1 tablet (5 mg total) by mouth every 6 (six) hours as needed for severe pain., Starting Wed 08/24/2018, Until Thu 08/24/2019, Print          Rockne MenghiniNorman, Anne-Caroline, MD 08/24/18 2209

## 2018-08-24 NOTE — ED Triage Notes (Addendum)
Pt arrives to ED via POV from home under direction of Dr Aida Puffer for an MRI d/t lower back pain r/t a "pinched nerve". Pt denies loss of lower extremity sensation, no loss of bowel or bladder control. Pt reports the lower back pain has been x1 week. Pt reports taking Tylenol for pain without relief.

## 2018-08-24 NOTE — ED Notes (Signed)
Reports chrom=nic back pain, states here for MRI tonight.

## 2018-08-24 NOTE — Discharge Instructions (Signed)
You may continue to use a heating pad for 15 minutes every 1-2 hours as needed.

## 2018-08-30 ENCOUNTER — Other Ambulatory Visit: Payer: Self-pay | Admitting: Family Medicine

## 2018-08-30 NOTE — Telephone Encounter (Signed)
Requested medication (s) are due for refill today:  yes  Requested medication (s) are on the active medication list:  yes  Future visit scheduled:  no  Last Refill: 08/20/17; #90; RF x 3  ** Called pt. To attempt to schedule an appt. (last OV 10/12/16)  Left vm to call office to schedule appt. With Dr. Olevia Perches    **Message being sent high priority, due to the importance of avoiding lapse in therapy.   Requested Prescriptions  Pending Prescriptions Disp Refills   XARELTO 20 MG TABS tablet [Pharmacy Med Name: Carlena Hurl 20MG  TABLETS] 90 tablet 3    Sig: TAKE 1 TABLET(20 MG) BY MOUTH DAILY WITH SUPPER     Hematology: Anticoagulants - rivaroxaban Failed - 08/30/2018 11:50 AM      Failed - ALT in normal range and within 180 days    No results found for: ALT       Failed - AST in normal range and within 180 days    No results found for: POCAST, AST       Failed - Cr in normal range and within 360 days    Creatinine, Ser  Date Value Ref Range Status  05/14/2016 0.92 0.61 - 1.24 mg/dL Final         Failed - HCT in normal range and within 360 days    HCT  Date Value Ref Range Status  05/14/2016 51.3 40.0 - 52.0 % Final         Failed - HGB in normal range and within 360 days    Hemoglobin  Date Value Ref Range Status  05/14/2016 17.7 13.0 - 18.0 g/dL Final         Failed - PLT in normal range and within 360 days    Platelets  Date Value Ref Range Status  05/14/2016 374 150 - 440 K/uL Final         Failed - Valid encounter within last 12 months    Recent Outpatient Visits          1 year ago Acute bilateral low back pain without sciatica   [facility] [place], [place], DO   2 years ago Right inguinal hernia   [facility] [place], [place]   3 years ago Depression   [facility] [place], [place], DO   3 years ago Unilateral inguinal hernia without obstruction or gangrene, recurrence not specified   [facility]  [place], Megan P, DO   3 years ago DVT (deep venous thrombosis), unspecified laterality   [facility] [place], Megan P, DO

## 2018-08-30 NOTE — Telephone Encounter (Signed)
Needs an appointment.

## 2018-08-31 NOTE — Telephone Encounter (Signed)
LVM for pt to call back, also printed letter to be sent.

## 2018-09-05 ENCOUNTER — Encounter: Payer: Self-pay | Admitting: Family Medicine

## 2018-09-05 ENCOUNTER — Ambulatory Visit (INDEPENDENT_AMBULATORY_CARE_PROVIDER_SITE_OTHER): Payer: Self-pay | Admitting: Family Medicine

## 2018-09-05 VITALS — BP 134/82 | HR 84 | Temp 98.1°F | Wt 127.0 lb

## 2018-09-05 DIAGNOSIS — J410 Simple chronic bronchitis: Secondary | ICD-10-CM

## 2018-09-05 DIAGNOSIS — I7410 Embolism and thrombosis of unspecified parts of aorta: Secondary | ICD-10-CM

## 2018-09-05 DIAGNOSIS — I749 Embolism and thrombosis of unspecified artery: Secondary | ICD-10-CM

## 2018-09-05 DIAGNOSIS — S22060A Wedge compression fracture of T7-T8 vertebra, initial encounter for closed fracture: Secondary | ICD-10-CM

## 2018-09-05 DIAGNOSIS — Z86711 Personal history of pulmonary embolism: Secondary | ICD-10-CM

## 2018-09-05 DIAGNOSIS — M546 Pain in thoracic spine: Secondary | ICD-10-CM

## 2018-09-05 MED ORDER — RIVAROXABAN 20 MG PO TABS: ORAL_TABLET | ORAL | 3 refills | Status: DC

## 2018-09-05 MED ORDER — TRAMADOL HCL 50 MG PO TABS: 50.0000 mg | ORAL_TABLET | Freq: Three times a day (TID) | ORAL | 0 refills | Status: DC | PRN

## 2018-09-05 NOTE — Assessment & Plan Note (Signed)
Will check lupus anticoagulant panel. Will continue xarelto. Call with any concerns. Continue to monitor.  

## 2018-09-05 NOTE — Assessment & Plan Note (Signed)
Will check lupus anticoagulant panel. Will continue xarelto. Call with any concerns. Continue to monitor.

## 2018-09-05 NOTE — Progress Notes (Signed)
BP 134/82   Pulse 84   Temp 98.1 F (36.7 C) (Oral)   Wt 127 lb (57.6 kg)   SpO2 97%   BMI 16.76 kg/m    Subjective:    Patient ID: Steve Chaney, male    DOB: [birth year], 63 y.o.   MRN: [id number]  HPI: Steve Chaney is a 63 y.o. male who presents today after being lost to follow up since 10/2016  Chief Complaint  Patient presents with  . Back Pain    pt states he has a pinched nerve in his back....was seen at ER and given prednisone, oxycodone, and flexeril   . paperwork    patient assistance forms for Xarelto   Steve Chaney has been in the ER 2x in the last month for back pain. He was concerned about a pinched nerve and went to the ER on 08/19/18. There he had an x-ray which showed a 60% compression fracture at T8 and a 60% compression fracture at L1. He left against medical advice, despite the fact that he was told that this fracture could paralyze him without getting an MRI. He went back to the ER on 08/24/18 due to pain that had gotten progressively worse. He was given oxycodone, medrol dose pack and a referral to orthopedics. He is self pay. He has not seen orthopedics and he has not gotten any better. He notes that he has not been feeling well. He notes that he has been in a lot of pain.   Has a history of arterial thrombosis, aortic embolism and history of PE. He has not had blood work done to look for hypercoagulable state. He was supposed to have a lupus anti-coagulant panel done at hematology in 2016 but never got it drawn. He has been on xarelto consistently without issue. He continues to smoke. He does not keep his appointments. He denies any weight loss and states that he has weighed 128 since he was 63 years old. He does not know why the hospital stated that he weighed 150 in the ER.   Relevant past medical, surgical, family and social history reviewed and updated as indicated. Interim medical history since our last visit reviewed. Allergies and medications reviewed and  updated.  Review of Systems  Constitutional: Positive for activity change. Negative for appetite change, chills, diaphoresis, fatigue, fever and unexpected weight change.  HENT: Negative.   Eyes: Negative.   Respiratory: Negative.   Cardiovascular: Negative.   Gastrointestinal: Negative.   Musculoskeletal: Positive for back pain and myalgias. Negative for arthralgias, gait problem, joint swelling, neck pain and neck stiffness.  Skin: Negative.   Neurological: Negative.   Psychiatric/Behavioral: Negative.     Per HPI unless specifically indicated above     Objective:    BP 134/82   Pulse 84   Temp 98.1 F (36.7 C) (Oral)   Wt 127 lb (57.6 kg)   SpO2 97%   BMI 16.76 kg/m   Wt Readings from Last 3 Encounters:  09/05/18 127 lb (57.6 kg)  08/24/18 150 lb (68 kg)  08/19/18 158 lb (71.7 kg)    Physical Exam Vitals signs and nursing note reviewed.  Constitutional:      General: He is not in acute distress.    Appearance: Normal appearance. He is not ill-appearing, toxic-appearing or diaphoretic.  HENT:     Head: Normocephalic and atraumatic.     Right Ear: External ear normal.     Left Ear: External ear normal.     Nose: Nose  normal.     Mouth/Throat:     Mouth: Mucous membranes are moist.     Pharynx: Oropharynx is clear.  Eyes:     General: No scleral icterus.       Right eye: No discharge.        Left eye: No discharge.     Extraocular Movements: Extraocular movements intact.     Conjunctiva/sclera: Conjunctivae normal.     Pupils: Pupils are equal, round, and reactive to light.  Neck:     Musculoskeletal: Normal range of motion and neck supple.  Cardiovascular:     Rate and Rhythm: Normal rate and regular rhythm.     Pulses: Normal pulses.     Heart sounds: Normal heart sounds. No murmur. No friction rub. No gallop.   Pulmonary:     Effort: Pulmonary effort is normal. No respiratory distress.     Breath sounds: Normal breath sounds. No stridor. No wheezing,  rhonchi or rales.  Chest:     Chest wall: No tenderness.  Musculoskeletal: Normal range of motion.        General: Tenderness (T8) present.  Skin:    General: Skin is warm and dry.     Capillary Refill: Capillary refill takes less than 2 seconds.     Coloration: Skin is not jaundiced or pale.     Findings: No bruising, erythema, lesion or rash.  Neurological:     General: No focal deficit present.     Mental Status: He is alert and oriented to person, place, and time. Mental status is at baseline.  Psychiatric:        Mood and Affect: Mood normal.        Behavior: Behavior normal.        Thought Content: Thought content normal.        Judgment: Judgment normal.     Results for orders placed or performed during the hospital encounter of 05/14/16  Surgical pcr screen  Result Value Ref Range   MRSA, PCR NEGATIVE NEGATIVE   Staphylococcus aureus NEGATIVE NEGATIVE  CBC  Result Value Ref Range   WBC 12.0 (H) 3.8 - 10.6 K/uL   RBC 5.65 4.40 - 5.90 MIL/uL   Hemoglobin 17.7 13.0 - 18.0 g/dL   HCT 16.1 09.6 - 04.5 %   MCV 90.9 80.0 - 100.0 fL   MCH 31.3 26.0 - 34.0 pg   MCHC 34.4 32.0 - 36.0 g/dL   RDW 40.9 (H) 81.1 - 91.4 %   Platelets 374 150 - 440 K/uL  Basic metabolic panel  Result Value Ref Range   Sodium 134 (L) 135 - 145 mmol/L   Potassium 4.1 3.5 - 5.1 mmol/L   Chloride 98 (L) 101 - 111 mmol/L   CO2 27 22 - 32 mmol/L   Glucose, Bld 118 (H) 65 - 99 mg/dL   BUN 9 6 - 20 mg/dL   Creatinine, Ser 7.82 0.61 - 1.24 mg/dL   Calcium 9.1 8.9 - 95.6 mg/dL   GFR calc non Af Amer >60 >60 mL/min   GFR calc Af Amer >60 >60 mL/min   Anion gap 9 5 - 15  Surgical pathology  Result Value Ref Range   SURGICAL PATHOLOGY      Surgical Pathology CASE: [id number] PATIENT: Julen Guse Surgical Pathology Report     SPECIMEN SUBMITTED: A. Omentum B. Hernia sac, right inguinal  CLINICAL HISTORY: None provided  PRE-OPERATIVE DIAGNOSIS: Right incarcerated inguinal  hernia  POST-OPERATIVE DIAGNOSIS: Same as pre-op  DIAGNOSIS: A. OMENTUM; OMENTECTOMY: - MATURE ADIPOSE TISSUE.  B. HERNIA SAC, RIGHT INGUINAL; REPAIR: - MESOTHELIAL LINED FIBROADIPOSE TISSUE, CONSISTENT WITH HERNIA SAC.   GROSS DESCRIPTION:  A. Labeled: omentum  Tissue fragment(s): 1  Size: 15.8 x 15.5 x 2.5 cm  Description: unoriented fragment of yellow lobulated fibrofatty tissue, section, yellow lobulated fibrofatty without masses  Representative submitted in 1 Cassette(s).   B. Labeled: right inguinal hernia sac  Tissue fragment(s): 1  Size: 8.7 x 3.6 x 1.1 cm  Description: purple tan fibrous blind ended pouch, surface wrinkled with no excrescent  Representative submitted in 1 cassette(s) .    Final Diagnosis performed by Glenice Bow, MD.  Electronically signed 05/18/2016 11:13:37AM    The electronic signature indicates that the named Attending Pathologist has evaluated the specimen  Technical component performed at [facility], [address], [place], [place] [address] Lab: [phone] Dir: Titus Dubin. Cato Mulligan, MD  Professional component performed at [facility], [address] [place], [place], [place] [address] Lab: [phone] Dir: Georgiann Cocker. Oneita Kras, MD        Assessment & Plan:   Problem List Items Addressed This Visit      Cardiovascular and Mediastinum   Arterial thrombosis (HCC)    Will check lupus anticoagulant panel. Will continue xarelto. Call with any concerns. Continue to monitor.       Relevant Medications   rivaroxaban (XARELTO) 20 MG TABS tablet   Other Relevant Orders   CBC with Differential/Platelet   Comprehensive metabolic panel   Lupus anticoagulant panel   Aortic embolism or thrombosis (HCC) - Primary    Will check lupus anticoagulant panel. Will continue xarelto. Call with any concerns. Continue to monitor.       Relevant Medications   rivaroxaban (XARELTO) 20 MG TABS tablet   Other Relevant  Orders   CBC with Differential/Platelet   Comprehensive metabolic panel   Lupus anticoagulant panel     Respiratory   COPD (chronic obstructive pulmonary disease) (HCC)    Not taking his medicine. Feeling OK right now. Call with any concerns.         Musculoskeletal and Integument   Compression fracture of T8 vertebra (HCC)    Needs to see ortho. Does not have insurance. Papers for [facility] charity care given today- he will fill them out. Urgent referral to ortho placed today. Tramadol for pain. Recheck 1 week if he has not seen ortho. Call with any concerns.       Relevant Orders   Ambulatory referral to Orthopedic Surgery     Other   History of pulmonary embolus (PE)    Will check lupus anticoagulant panel. Will continue xarelto. Call with any concerns. Continue to monitor.       Relevant Orders   CBC with Differential/Platelet   Comprehensive metabolic panel   Lupus anticoagulant panel    Other Visit Diagnoses    Acute midline thoracic back pain       Severe. Will treat with tramadol and get him into see ortho ASAP. Referral generated today.   Relevant Medications   traMADol (ULTRAM) 50 MG tablet   Other Relevant Orders   Ambulatory referral to Orthopedic Surgery       Follow up plan: Return in about 1 week (around 09/12/2018) for follow up pain.

## 2018-09-05 NOTE — Assessment & Plan Note (Signed)
Needs to see ortho. Does not have insurance. Papers for [facility] charity care given today- he will fill them out. Urgent referral to ortho placed today. Tramadol for pain. Recheck 1 week if he has not seen ortho. Call with any concerns.

## 2018-09-05 NOTE — Assessment & Plan Note (Signed)
Not taking his medicine. Feeling OK right now. Call with any concerns.

## 2018-09-06 ENCOUNTER — Other Ambulatory Visit: Payer: Self-pay | Admitting: Family Medicine

## 2018-09-06 MED ORDER — RIVAROXABAN 20 MG PO TABS: ORAL_TABLET | ORAL | 3 refills | Status: DC

## 2018-09-07 LAB — COMPREHENSIVE METABOLIC PANEL
A/G RATIO: 1.6 (ref 1.2–2.2)
ALT: 16 IU/L (ref 0–44)
AST: 15 IU/L (ref 0–40)
Albumin: 4.3 g/dL (ref 3.8–4.8)
Alkaline Phosphatase: 120 IU/L — ABNORMAL HIGH (ref 39–117)
BUN / CREAT RATIO: 11 (ref 10–24)
BUN: 9 mg/dL (ref 8–27)
Bilirubin Total: 0.2 mg/dL (ref 0.0–1.2)
CO2: 22 mmol/L (ref 20–29)
Calcium: 9.1 mg/dL (ref 8.6–10.2)
Chloride: 101 mmol/L (ref 96–106)
Creatinine, Ser: 0.85 mg/dL (ref 0.76–1.27)
GFR calc Af Amer: 107 mL/min/{1.73_m2} (ref >59)
GFR calc non Af Amer: 93 mL/min/{1.73_m2} (ref >59)
Globulin, Total: 2.7 g/dL (ref 1.5–4.5)
Glucose: 69 mg/dL (ref 65–99)
Potassium: 4.5 mmol/L (ref 3.5–5.2)
SODIUM: 139 mmol/L (ref 134–144)
Total Protein: 7 g/dL (ref 6.0–8.5)

## 2018-09-07 LAB — LUPUS ANTICOAGULANT PANEL
Dilute Viper Venom Time: 61 s — ABNORMAL HIGH (ref 0.0–47.0)
PTT Lupus Anticoagulant: 40.6 s (ref 0.0–51.9)

## 2018-09-07 LAB — CBC WITH DIFFERENTIAL/PLATELET
Basophils Absolute: 0.1 10*3/uL (ref 0.0–0.2)
Basos: 1 %
EOS (ABSOLUTE): 0.3 10*3/uL (ref 0.0–0.4)
Eos: 3 %
HEMATOCRIT: 40.8 % (ref 37.5–51.0)
Hemoglobin: 14.2 g/dL (ref 13.0–17.7)
Immature Grans (Abs): 0 10*3/uL (ref 0.0–0.1)
Immature Granulocytes: 0 %
LYMPHS ABS: 1.3 10*3/uL (ref 0.7–3.1)
Lymphs: 12 %
MCH: 32.1 pg (ref 26.6–33.0)
MCHC: 34.8 g/dL (ref 31.5–35.7)
MCV: 92 fL (ref 79–97)
Monocytes Absolute: 0.9 10*3/uL (ref 0.1–0.9)
Monocytes: 9 %
Neutrophils Absolute: 7.7 10*3/uL — ABNORMAL HIGH (ref 1.4–7.0)
Neutrophils: 75 %
Platelets: 511 10*3/uL — ABNORMAL HIGH (ref 150–450)
RBC: 4.42 x10E6/uL (ref 4.14–5.80)
RDW: 14 % (ref 11.6–15.4)
WBC: 10.2 10*3/uL (ref 3.4–10.8)

## 2018-09-07 LAB — DRVVT MIX: dRVVT Mix: 46.8 s (ref 0.0–47.0)

## 2018-09-09 ENCOUNTER — Telehealth: Payer: Self-pay | Admitting: Family Medicine

## 2018-09-09 NOTE — Telephone Encounter (Signed)
Was given 1 week supply. Cannot have refill on this until he comes in on Monday.

## 2018-09-09 NOTE — Telephone Encounter (Signed)
Called patient no answer. Left VM for pt to return call to the office.

## 2018-09-09 NOTE — Telephone Encounter (Signed)
Medication refill not delegated to NT routing back to office

## 2018-09-09 NOTE — Telephone Encounter (Signed)
Copied from CRM [id number]. Topic: Quick Communication - Rx Refill/Question >> Sep 09, 2018  2:50 PM Steve Chaney wrote: Medication: traMADol (ULTRAM) 50 MG tablet  Has the patient contacted their pharmacy? Yes - needs new script (Agent: If no, request that the patient contact the pharmacy for the refill.) (Agent: If yes, when and what did the pharmacy advise?)  Preferred Pharmacy (with phone number or street name): [id number] [place],  - [address] AT [facility] [address] [phone] (Phone) [phone] (Fax)  Agent: Please be advised that RX refills may take up to 3 business days. We ask that you follow-up with your pharmacy.

## 2018-09-11 ENCOUNTER — Telehealth: Payer: Self-pay | Admitting: Family Medicine

## 2018-09-11 NOTE — Telephone Encounter (Signed)
Copied from CRM [id number]. Topic: Referral - Status >> Sep 09, 2018  2:48 PM Steve Chaney wrote: Reason for CRM:   Pt states wherever he was referred for his MRI is telling him that they have not received prior records and x-rays.  Pt states he needs these sent. >> Sep 09, 2018  3:12 PM Steve Chaney wrote: Please advise was unsure if this needed to come to you or exactly who to send this to.  Thank You

## 2018-09-11 NOTE — Progress Notes (Deleted)
There were no vitals taken for this visit.   Subjective:    Patient ID: Steve Chaney, male    DOB: 08-07-1955, 63 y.o.   MRN: [id number]  HPI: [name] is a 63 y.o. male  No chief complaint on file.   Relevant past medical, surgical, family and social history reviewed and updated as indicated. Interim medical history since our last visit reviewed. Allergies and medications reviewed and updated.  Review of Systems  Per HPI unless specifically indicated above     Objective:    There were no vitals taken for this visit.  Wt Readings from Last 3 Encounters:  09/05/18 127 lb (57.6 kg)  08/24/18 150 lb (68 kg)  08/19/18 158 lb (71.7 kg)    Physical Exam  Results for orders placed or performed in visit on 09/05/18  CBC with Differential/Platelet  Result Value Ref Range   WBC 10.2 3.4 - 10.8 x10E3/uL   RBC 4.42 4.14 - 5.80 x10E6/uL   Hemoglobin 14.2 13.0 - 17.7 g/dL   Hematocrit 51.7 00.1 - 51.0 %   MCV 92 79 - 97 fL   MCH 32.1 26.6 - 33.0 pg   MCHC 34.8 31.5 - 35.7 g/dL   RDW 74.9 44.9 - 67.5 %   Platelets 511 (H) 150 - 450 x10E3/uL   Neutrophils 75 Not Estab. %   Lymphs 12 Not Estab. %   Monocytes 9 Not Estab. %   Eos 3 Not Estab. %   Basos 1 Not Estab. %   Neutrophils Absolute 7.7 (H) 1.4 - 7.0 x10E3/uL   Lymphocytes Absolute 1.3 0.7 - 3.1 x10E3/uL   Monocytes Absolute 0.9 0.1 - 0.9 x10E3/uL   EOS (ABSOLUTE) 0.3 0.0 - 0.4 x10E3/uL   Basophils Absolute 0.1 0.0 - 0.2 x10E3/uL   Immature Granulocytes 0 Not Estab. %   Immature Grans (Abs) 0.0 0.0 - 0.1 x10E3/uL  Comprehensive metabolic panel  Result Value Ref Range   Glucose 69 65 - 99 mg/dL   BUN 9 8 - 27 mg/dL   Creatinine, Ser 9.16 0.76 - 1.27 mg/dL   GFR calc non Af Amer 93 >59 mL/min/1.73   GFR calc Af Amer 107 >59 mL/min/1.73   BUN/Creatinine Ratio 11 10 - 24   Sodium 139 134 - 144 mmol/L   Potassium 4.5 3.5 - 5.2 mmol/L   Chloride 101 96 - 106 mmol/L   CO2 22 20 - 29 mmol/L   Calcium 9.1 8.6  - 10.2 mg/dL   Total Protein 7.0 6.0 - 8.5 g/dL   Albumin 4.3 3.8 - 4.8 g/dL   Globulin, Total 2.7 1.5 - 4.5 g/dL   Albumin/Globulin Ratio 1.6 1.2 - 2.2   Bilirubin Total 0.2 0.0 - 1.2 mg/dL   Alkaline Phosphatase 120 (H) 39 - 117 IU/L   AST 15 0 - 40 IU/L   ALT 16 0 - 44 IU/L  Lupus anticoagulant panel  Result Value Ref Range   PTT Lupus Anticoagulant 40.6 0.0 - 51.9 sec   Dilute Viper Venom Time 61.0 (H) 0.0 - 47.0 sec   Lupus Anticoag Interp Comment:   dRVVT Mix  Result Value Ref Range   dRVVT Mix 46.8 0.0 - 47.0 sec      Assessment & Plan:   Problem List Items Addressed This Visit      Musculoskeletal and Integument   Compression fracture of T8 vertebra (HCC) - Primary       Follow up plan: No follow-ups on file.

## 2018-09-11 NOTE — Telephone Encounter (Signed)
I did not order MRI- unclear where this is to be done. Will discuss at his appointment tomorrow.

## 2018-09-12 ENCOUNTER — Ambulatory Visit: Payer: Self-pay | Admitting: Family Medicine

## 2018-09-12 ENCOUNTER — Encounter: Payer: Self-pay | Admitting: Family Medicine

## 2018-09-13 ENCOUNTER — Ambulatory Visit (INDEPENDENT_AMBULATORY_CARE_PROVIDER_SITE_OTHER): Payer: Self-pay | Admitting: Family Medicine

## 2018-09-13 ENCOUNTER — Encounter: Payer: Self-pay | Admitting: Family Medicine

## 2018-09-13 VITALS — BP 128/93 | HR 76 | Temp 98.7°F | Wt 129.0 lb

## 2018-09-13 DIAGNOSIS — S22060A Wedge compression fracture of T7-T8 vertebra, initial encounter for closed fracture: Secondary | ICD-10-CM

## 2018-09-13 MED ORDER — TRAMADOL HCL 50 MG PO TABS: 50.0000 mg | ORAL_TABLET | Freq: Three times a day (TID) | ORAL | 0 refills | Status: DC | PRN

## 2018-09-13 NOTE — Progress Notes (Signed)
BP (!) 128/93   Pulse 76   Temp 98.7 F (37.1 C) (Oral)   Wt 129 lb (58.5 kg)   SpO2 99%   BMI 17.02 kg/m    Subjective:    Patient ID: Steve Chaney, male    DOB: 07-Jul-1956, 63 y.o.   MRN: [id number]  HPI: Steve Chaney is a 63 y.o. male  Chief Complaint  Patient presents with  . Fracture    Needs tramadol refill    Doing a bit better, but still in a significant amount of pain. He is scheduled to see the neurosurgeon at [facility] for an MRI on 09/22/18. He notes that his pain is still in the middle of his back. Worse with working. Better with rest and with medicine. He is still working, which is making things worse. He denies any belly pain. He is otherwise feeling well with no other concerns or complaints at this time.   Relevant past medical, surgical, family and social history reviewed and updated as indicated. Interim medical history since our last visit reviewed. Allergies and medications reviewed and updated.  Review of Systems  Constitutional: Negative.   Respiratory: Negative.   Cardiovascular: Negative.   Gastrointestinal: Negative.   Musculoskeletal: Positive for back pain and myalgias. Negative for arthralgias, gait problem, joint swelling, neck pain and neck stiffness.  Skin: Negative.   Neurological: Negative.   Psychiatric/Behavioral: Negative.     Per HPI unless specifically indicated above     Objective:    BP (!) 128/93   Pulse 76   Temp 98.7 F (37.1 C) (Oral)   Wt 129 lb (58.5 kg)   SpO2 99%   BMI 17.02 kg/m   Wt Readings from Last 3 Encounters:  09/13/18 129 lb (58.5 kg)  09/05/18 127 lb (57.6 kg)  08/24/18 150 lb (68 kg)    Physical Exam Vitals signs and nursing note reviewed.  Constitutional:      General: He is not in acute distress.    Appearance: Normal appearance. He is not ill-appearing, toxic-appearing or diaphoretic.  HENT:     Head: Normocephalic and atraumatic.     Right Ear: External ear normal.     Left Ear: External ear  normal.     Nose: Nose normal.     Mouth/Throat:     Mouth: Mucous membranes are moist.     Pharynx: Oropharynx is clear.  Eyes:     General: No scleral icterus.       Right eye: No discharge.        Left eye: No discharge.     Extraocular Movements: Extraocular movements intact.     Conjunctiva/sclera: Conjunctivae normal.     Pupils: Pupils are equal, round, and reactive to light.  Neck:     Musculoskeletal: Normal range of motion and neck supple.  Cardiovascular:     Rate and Rhythm: Normal rate and regular rhythm.     Pulses: Normal pulses.     Heart sounds: Normal heart sounds. No murmur. No friction rub. No gallop.   Pulmonary:     Effort: Pulmonary effort is normal. No respiratory distress.     Breath sounds: Normal breath sounds. No stridor. No wheezing, rhonchi or rales.  Chest:     Chest wall: No tenderness.  Musculoskeletal: Normal range of motion.        General: Tenderness present.  Skin:    General: Skin is warm and dry.     Capillary Refill: Capillary refill takes less than  2 seconds.     Coloration: Skin is not jaundiced or pale.     Findings: No bruising, erythema, lesion or rash.  Neurological:     General: No focal deficit present.     Mental Status: He is alert and oriented to person, place, and time. Mental status is at baseline.  Psychiatric:        Mood and Affect: Mood normal.        Behavior: Behavior normal.        Thought Content: Thought content normal.        Judgment: Judgment normal.     Results for orders placed or performed in visit on 09/05/18  CBC with Differential/Platelet  Result Value Ref Range   WBC 10.2 3.4 - 10.8 x10E3/uL   RBC 4.42 4.14 - 5.80 x10E6/uL   Hemoglobin 14.2 13.0 - 17.7 g/dL   Hematocrit 16.1 09.6 - 51.0 %   MCV 92 79 - 97 fL   MCH 32.1 26.6 - 33.0 pg   MCHC 34.8 31.5 - 35.7 g/dL   RDW 04.5 40.9 - 81.1 %   Platelets 511 (H) 150 - 450 x10E3/uL   Neutrophils 75 Not Estab. %   Lymphs 12 Not Estab. %   Monocytes  9 Not Estab. %   Eos 3 Not Estab. %   Basos 1 Not Estab. %   Neutrophils Absolute 7.7 (H) 1.4 - 7.0 x10E3/uL   Lymphocytes Absolute 1.3 0.7 - 3.1 x10E3/uL   Monocytes Absolute 0.9 0.1 - 0.9 x10E3/uL   EOS (ABSOLUTE) 0.3 0.0 - 0.4 x10E3/uL   Basophils Absolute 0.1 0.0 - 0.2 x10E3/uL   Immature Granulocytes 0 Not Estab. %   Immature Grans (Abs) 0.0 0.0 - 0.1 x10E3/uL  Comprehensive metabolic panel  Result Value Ref Range   Glucose 69 65 - 99 mg/dL   BUN 9 8 - 27 mg/dL   Creatinine, Ser 9.14 0.76 - 1.27 mg/dL   GFR calc non Af Amer 93 >59 mL/min/1.73   GFR calc Af Amer 107 >59 mL/min/1.73   BUN/Creatinine Ratio 11 10 - 24   Sodium 139 134 - 144 mmol/L   Potassium 4.5 3.5 - 5.2 mmol/L   Chloride 101 96 - 106 mmol/L   CO2 22 20 - 29 mmol/L   Calcium 9.1 8.6 - 10.2 mg/dL   Total Protein 7.0 6.0 - 8.5 g/dL   Albumin 4.3 3.8 - 4.8 g/dL   Globulin, Total 2.7 1.5 - 4.5 g/dL   Albumin/Globulin Ratio 1.6 1.2 - 2.2   Bilirubin Total 0.2 0.0 - 1.2 mg/dL   Alkaline Phosphatase 120 (H) 39 - 117 IU/L   AST 15 0 - 40 IU/L   ALT 16 0 - 44 IU/L  Lupus anticoagulant panel  Result Value Ref Range   PTT Lupus Anticoagulant 40.6 0.0 - 51.9 sec   Dilute Viper Venom Time 61.0 (H) 0.0 - 47.0 sec   Lupus Anticoag Interp Comment:   dRVVT Mix  Result Value Ref Range   dRVVT Mix 46.8 0.0 - 47.0 sec      Assessment & Plan:   Problem List Items Addressed This Visit      Musculoskeletal and Integument   Compression fracture of T8 vertebra (HCC) - Primary    To have MRI and see neurosurgery in the next couple of weeks. Refill of his tramadol given today for 1 month. Follow up 1 month if pain not resolved. Call with any concerns.  Follow up plan: Return in about 4 weeks (around 10/11/2018).

## 2018-09-13 NOTE — Assessment & Plan Note (Signed)
To have MRI and see neurosurgery in the next couple of weeks. Refill of his tramadol given today for 1 month. Follow up 1 month if pain not resolved. Call with any concerns.

## 2018-10-14 ENCOUNTER — Telehealth: Payer: Self-pay | Admitting: Family Medicine

## 2018-10-14 NOTE — Telephone Encounter (Signed)
Called pt in regards to setting up virtual visit for his appt Monday no answer left voicemail.

## 2018-10-17 ENCOUNTER — Other Ambulatory Visit: Payer: Self-pay

## 2018-10-17 ENCOUNTER — Ambulatory Visit (INDEPENDENT_AMBULATORY_CARE_PROVIDER_SITE_OTHER): Payer: Self-pay | Admitting: Family Medicine

## 2018-10-17 DIAGNOSIS — Z5321 Procedure and treatment not carried out due to patient leaving prior to being seen by health care provider: Secondary | ICD-10-CM

## 2018-10-17 NOTE — Progress Notes (Signed)
Called patient several times at pre-arranged times. LMOM for him to call back. He did not.

## 2019-09-01 ENCOUNTER — Ambulatory Visit: Payer: Self-pay | Admitting: Family Medicine

## 2019-09-08 ENCOUNTER — Ambulatory Visit: Payer: Self-pay | Admitting: Family Medicine

## 2019-09-13 ENCOUNTER — Other Ambulatory Visit: Payer: Self-pay | Admitting: Family Medicine

## 2019-09-13 NOTE — Telephone Encounter (Signed)
Needs appointment

## 2019-09-13 NOTE — Telephone Encounter (Signed)
Scheduled for tomorrow.

## 2019-09-13 NOTE — Telephone Encounter (Signed)
LOV: 10/17/2018, NOV: 09/14/2018 with Olevia Perches, DO.

## 2019-09-14 ENCOUNTER — Ambulatory Visit: Payer: Self-pay | Admitting: Family Medicine

## 2019-10-16 ENCOUNTER — Other Ambulatory Visit: Payer: Self-pay | Admitting: Family Medicine

## 2019-10-16 NOTE — Telephone Encounter (Signed)
Requested medication (s) are due for refill today   YES  Requested medication (s) are on the active medication list  YES  Future visit scheduled NO.  TC to patient. Left VM to return call and schedule visit in order to refill medications.   Note to clinic: LOV  09/13/18. Last 4 visits cancelled/no show by patient. Last refilled 09/14/19.   Requested Prescriptions  Pending Prescriptions Disp Refills   XARELTO 20 MG TABS tablet [Pharmacy Med Name: XARELTO 20MG  TABLETS] 30 tablet 0    Sig: TAKE 1 TABLET(20 MG) BY MOUTH DAILY WITH SUPPER      Hematology: Anticoagulants - rivaroxaban Failed - 10/16/2019 12:12 PM      Failed - ALT in normal range and within 180 days    ALT  Date Value Ref Range Status  09/05/2018 16 0 - 44 IU/L Final          Failed - AST in normal range and within 180 days    AST  Date Value Ref Range Status  09/05/2018 15 0 - 40 IU/L Final          Failed - Cr in normal range and within 360 days    Creatinine, Ser  Date Value Ref Range Status  09/05/2018 0.85 0.76 - 1.27 mg/dL Final          Failed - HCT in normal range and within 360 days    Hematocrit  Date Value Ref Range Status  09/05/2018 40.8 37.5 - 51.0 % Final          Failed - HGB in normal range and within 360 days    Hemoglobin  Date Value Ref Range Status  09/05/2018 14.2 13.0 - 17.7 g/dL Final          Failed - PLT in normal range and within 360 days    Platelets  Date Value Ref Range Status  09/05/2018 511 (H) 150 - 450 x10E3/uL Final          Failed - Valid encounter within last 12 months    Recent Outpatient Visits           12 months ago Procedure and treatment not carried out due to patient leaving prior to being seen by health care provider   [facility] P, DO   1 year ago Compression fracture of T8 vertebra, initial encounter [facility])   [facility] [place], Megan P, DO   1 year ago Aortic embolism or thrombosis [facility])   [facility] [place], Megan P, DO   3 years ago Acute bilateral low back pain without sciatica   [facility] [place], [place], DO   3 years ago Right inguinal hernia   [facility] [place], [place]

## 2019-10-16 NOTE — Telephone Encounter (Signed)
See PEC message below.  

## 2019-11-07 ENCOUNTER — Other Ambulatory Visit: Payer: Self-pay | Admitting: Family Medicine

## 2019-11-07 NOTE — Telephone Encounter (Signed)
Requested medication (s) are due for refill today - expired rx  Requested medication (s) are on the active medication list -yes-reported not taking  Future visit scheduled -no  Last refill: 05/13/15  Notes to clinic: Patient requesting RF of expired Rx- see note from agent  Requested Prescriptions  Pending Prescriptions Disp Refills   albuterol (VENTOLIN HFA) 108 (90 Base) MCG/ACT inhaler      Sig: Inhale 2 puffs into the lungs every 6 (six) hours as needed for wheezing or shortness of breath.      Pulmonology:  Beta Agonists Failed - 11/07/2019  4:13 PM      Failed - One inhaler should last at least one month. If the patient is requesting refills earlier, contact the patient to check for uncontrolled symptoms.      Failed - Valid encounter within last 12 months    Recent Outpatient Visits           1 year ago Procedure and treatment not carried out due to patient leaving prior to being seen by health care provider   [facility], Megan P, DO   1 year ago Compression fracture of T8 vertebra, initial encounter [facility])   [facility] [place], Megan P, DO   1 year ago Aortic embolism or thrombosis [facility])   [facility] [place], Megan P, DO   3 years ago Acute bilateral low back pain without sciatica   [facility] [place], [place], DO   3 years ago Right inguinal hernia   [facility] [place] M[place], [place]                  Requested Prescriptions  Pending Prescriptions Disp Refills   albuterol (VENTOLIN HFA) 108 (90 Base) MCG/ACT inhaler      Sig: Inhale 2 puffs into the lungs every 6 (six) hours as needed for wheezing or shortness of breath.      Pulmonology:  Beta Agonists Failed - 11/07/2019  4:13 PM      Failed - One inhaler should last at least one month. If the patient is requesting refills earlier, contact the patient to check for uncontrolled symptoms.      Failed - Valid encounter within  last 12 months    Recent Outpatient Visits           1 year ago Procedure and treatment not carried out due to patient leaving prior to being seen by health care provider   [facility], Megan P, DO   1 year ago Compression fracture of T8 vertebra, initial encounter [facility])   [facility] [place], Megan P, DO   1 year ago Aortic embolism or thrombosis [facility])   [facility] [place], Megan P, DO   3 years ago Acute bilateral low back pain without sciatica   [facility] [place], [place], DO   3 years ago Right inguinal hernia   [facility], [place]

## 2019-11-07 NOTE — Telephone Encounter (Signed)
Copied from CRM [id number]. Topic: Quick Communication - Rx Refill/Question >> Nov 07, 2019  4:03 PM Jaquita Rector A wrote: Medication: albuterol (PROVENTIL HFA;VENTOLIN HFA) 108 (90 BASE) MCG/ACT inhaler  Patient request a rescue inhaler because he ended up in the hospital with breathing issues in [place]  Has the patient contacted their pharmacy? Yes.   (Agent: If no, request that the patient contact the pharmacy for the refill.) (Agent: If yes, when and what did the pharmacy advise?)  Preferred Pharmacy (with phone number or street name): [id number] - [place], [place] - [address] AT [facility] [address]  Phone:  [phone] Fax:  [phone]     Agent: Please be advised that RX refills may take up to 3 business days. We ask that you follow-up with your pharmacy.

## 2019-11-08 ENCOUNTER — Telehealth: Payer: Self-pay | Admitting: Family Medicine

## 2019-11-08 ENCOUNTER — Encounter: Payer: Self-pay | Admitting: Family Medicine

## 2019-11-08 NOTE — Telephone Encounter (Signed)
Called pt to schedule an appt for medication refills, no answer, left vm. Sending letter.

## 2019-11-08 NOTE — Telephone Encounter (Signed)
Patient [place] right now and cannot make an appt. Can patient do virtual appt? Patient was very upset about not getting prescription. Patient states he is in dire need of his inhaler due that he leaves with smokers.  Call back [phone]

## 2019-11-08 NOTE — Telephone Encounter (Signed)
Called pt to schedule an appt for medication refills, no answer, left vm. Sending letter. 

## 2019-11-08 NOTE — Telephone Encounter (Signed)
Can patient do a virtual visit?

## 2019-11-09 ENCOUNTER — Encounter: Payer: Self-pay | Admitting: Family Medicine

## 2019-11-09 ENCOUNTER — Ambulatory Visit (INDEPENDENT_AMBULATORY_CARE_PROVIDER_SITE_OTHER): Payer: Self-pay | Admitting: Family Medicine

## 2019-11-09 DIAGNOSIS — I749 Embolism and thrombosis of unspecified artery: Secondary | ICD-10-CM

## 2019-11-09 DIAGNOSIS — J410 Simple chronic bronchitis: Secondary | ICD-10-CM

## 2019-11-09 DIAGNOSIS — Z86711 Personal history of pulmonary embolism: Secondary | ICD-10-CM

## 2019-11-09 DIAGNOSIS — I7410 Embolism and thrombosis of unspecified parts of aorta: Secondary | ICD-10-CM

## 2019-11-09 MED ORDER — IPRATROPIUM BROMIDE 0.02 % IN SOLN: 0.2500 mg | Freq: Four times a day (QID) | RESPIRATORY_TRACT | 6 refills | Status: DC | PRN

## 2019-11-09 MED ORDER — BREO ELLIPTA 100-25 MCG/INH IN AEPB: 1.0000 | INHALATION_SPRAY | Freq: Every day | RESPIRATORY_TRACT | 12 refills | Status: DC

## 2019-11-09 MED ORDER — ALBUTEROL SULFATE HFA 108 (90 BASE) MCG/ACT IN AERS: 2.0000 | INHALATION_SPRAY | Freq: Four times a day (QID) | RESPIRATORY_TRACT | 3 refills | Status: DC | PRN

## 2019-11-09 MED ORDER — RIVAROXABAN 20 MG PO TABS: ORAL_TABLET | ORAL | 0 refills | Status: DC

## 2019-11-09 NOTE — Assessment & Plan Note (Signed)
Needs refill on his xarelto. Refilled today. Needs blood work. We will check in 3 months when he's back in [place].  

## 2019-11-09 NOTE — Assessment & Plan Note (Signed)
Needs refill on his xarelto. Refilled today. Needs blood work. We will check in 3 months when he's back in [place].  

## 2019-11-09 NOTE — Assessment & Plan Note (Signed)
Needs refill on his xarelto. Refilled today. Needs blood work. We will check in 3 months when he's back in [place].

## 2019-11-09 NOTE — Assessment & Plan Note (Signed)
Having more issues while living with smokers. Will restart his medicine. Continue to monitor. Call with any concerns.

## 2019-11-09 NOTE — Progress Notes (Signed)
There were no vitals taken for this visit.   Subjective:    Patient ID: Steve Chaney, male    DOB: October 11, 1955, 64 y.o.   MRN: [id number]  HPI: Steve Chaney is a 64 y.o. male who has been lost to follow up for about a year.   Chief Complaint  Patient presents with  . COPD   COPD- quit smoking about 3 years ago. He has been living in [place] and staying with guys who are smoking all the time. Because of that, he has been having a lot more trouble with his breathing COPD status: exacerbated Satisfied with current treatment?: no Oxygen use: no Dyspnea frequency:  Cough frequency:  Rescue inhaler frequency:   Limitation of activity: no Productive cough:  Pneumovax: Not up to Date Influenza: Not up to Date  Relevant past medical, surgical, family and social history reviewed and updated as indicated. Interim medical history since our last visit reviewed. Allergies and medications reviewed and updated.  Review of Systems  Constitutional: Negative.   HENT: Negative.   Respiratory: Positive for shortness of breath. Negative for apnea, cough, choking, chest tightness, wheezing and stridor.   Cardiovascular: Negative.   Gastrointestinal: Negative.   Musculoskeletal: Negative.   Neurological: Negative.   Psychiatric/Behavioral: Negative.     Per HPI unless specifically indicated above     Objective:    There were no vitals taken for this visit.  Wt Readings from Last 3 Encounters:  09/13/18 129 lb (58.5 kg)  09/05/18 127 lb (57.6 kg)  08/24/18 150 lb (68 kg)    Physical Exam Vitals and nursing note reviewed.  Constitutional:      General: He is not in acute distress.    Appearance: Normal appearance. He is not ill-appearing, toxic-appearing or diaphoretic.  HENT:     Head: Normocephalic and atraumatic.     Right Ear: External ear normal.     Left Ear: External ear normal.     Nose: Nose normal.     Mouth/Throat:     Mouth: Mucous membranes are moist.   Pharynx: Oropharynx is clear.  Eyes:     General: No scleral icterus.       Right eye: No discharge.        Left eye: No discharge.     Conjunctiva/sclera: Conjunctivae normal.     Pupils: Pupils are equal, round, and reactive to light.  Pulmonary:     Effort: Pulmonary effort is normal. No respiratory distress.     Comments: Speaking in full sentences Musculoskeletal:        General: Normal range of motion.     Cervical back: Normal range of motion.  Skin:    Coloration: Skin is not jaundiced or pale.     Findings: No bruising, erythema, lesion or rash.  Neurological:     Mental Status: He is alert and oriented to person, place, and time. Mental status is at baseline.  Psychiatric:        Mood and Affect: Mood normal.        Behavior: Behavior normal.        Thought Content: Thought content normal.        Judgment: Judgment normal.     Results for orders placed or performed in visit on 09/05/18  CBC with Differential/Platelet  Result Value Ref Range   WBC 10.2 3.4 - 10.8 x10E3/uL   RBC 4.42 4.14 - 5.80 x10E6/uL   Hemoglobin 14.2 13.0 - 17.7 g/dL   Hematocrit  40.8 37.5 - 51.0 %   MCV 92 79 - 97 fL   MCH 32.1 26.6 - 33.0 pg   MCHC 34.8 31.5 - 35.7 g/dL   RDW 20.9 47.0 - 96.2 %   Platelets 511 (H) 150 - 450 x10E3/uL   Neutrophils 75 Not Estab. %   Lymphs 12 Not Estab. %   Monocytes 9 Not Estab. %   Eos 3 Not Estab. %   Basos 1 Not Estab. %   Neutrophils Absolute 7.7 (H) 1.4 - 7.0 x10E3/uL   Lymphocytes Absolute 1.3 0.7 - 3.1 x10E3/uL   Monocytes Absolute 0.9 0.1 - 0.9 x10E3/uL   EOS (ABSOLUTE) 0.3 0.0 - 0.4 x10E3/uL   Basophils Absolute 0.1 0.0 - 0.2 x10E3/uL   Immature Granulocytes 0 Not Estab. %   Immature Grans (Abs) 0.0 0.0 - 0.1 x10E3/uL  Comprehensive metabolic panel  Result Value Ref Range   Glucose 69 65 - 99 mg/dL   BUN 9 8 - 27 mg/dL   Creatinine, Ser 8.36 0.76 - 1.27 mg/dL   GFR calc non Af Amer 93 >59 mL/min/1.73   GFR calc Af Amer 107 >59 mL/min/1.73    BUN/Creatinine Ratio 11 10 - 24   Sodium 139 134 - 144 mmol/L   Potassium 4.5 3.5 - 5.2 mmol/L   Chloride 101 96 - 106 mmol/L   CO2 22 20 - 29 mmol/L   Calcium 9.1 8.6 - 10.2 mg/dL   Total Protein 7.0 6.0 - 8.5 g/dL   Albumin 4.3 3.8 - 4.8 g/dL   Globulin, Total 2.7 1.5 - 4.5 g/dL   Albumin/Globulin Ratio 1.6 1.2 - 2.2   Bilirubin Total 0.2 0.0 - 1.2 mg/dL   Alkaline Phosphatase 120 (H) 39 - 117 IU/L   AST 15 0 - 40 IU/L   ALT 16 0 - 44 IU/L  Lupus anticoagulant panel  Result Value Ref Range   PTT Lupus Anticoagulant 40.6 0.0 - 51.9 sec   Dilute Viper Venom Time 61.0 (H) 0.0 - 47.0 sec   Lupus Anticoag Interp Comment:   dRVVT Mix  Result Value Ref Range   dRVVT Mix 46.8 0.0 - 47.0 sec      Assessment & Plan:   Problem List Items Addressed This Visit      Cardiovascular and Mediastinum   Arterial thrombosis (HCC)    Needs refill on his xarelto. Refilled today. Needs blood work. We will check in 3 months when he's back in [place].       Relevant Medications   rivaroxaban (XARELTO) 20 MG TABS tablet   Aortic embolism or thrombosis (HCC)    Needs refill on his xarelto. Refilled today. Needs blood work. We will check in 3 months when he's back in [place].       Relevant Medications   rivaroxaban (XARELTO) 20 MG TABS tablet     Respiratory   COPD (chronic obstructive pulmonary disease) (HCC) - Primary    Having more issues while living with smokers. Will restart his medicine. Continue to monitor. Call with any concerns.       Relevant Medications   fluticasone furoate-vilanterol (BREO ELLIPTA) 100-25 MCG/INH AEPB   albuterol (VENTOLIN HFA) 108 (90 Base) MCG/ACT inhaler   ipratropium (ATROVENT) 0.02 % nebulizer solution   Other Relevant Orders   Referral to Chronic Care Management Services     Other   History of pulmonary embolus (PE)    Needs refill on his xarelto. Refilled today. Needs blood work. We will check in  3 months when he's back in [place].           Follow up  plan: Return in about 3 months (around 02/08/2020).   . This visit was completed via telephone due to the restrictions of the COVID-19 pandemic. All issues as above were discussed and addressed but no physical exam was performed. If it was felt that the patient should be evaluated in the office, they were directed there. The patient verbally consented to this visit. Patient was unable to complete an audio/visual visit due to Lack of equipment. Due to the catastrophic nature of the COVID-19 pandemic, this visit was done through audio contact only. . Location of the patient: home . Location of the provider: work . Those involved with this call:  . Provider: Olevia Perches, DO . CMA: Floydene Flock, RMA . Front Desk/Registration: Adela Ports  . Time spent on call: 25 minutes on the phone discussing health concerns. 40 minutes total spent in review of patient's record and preparation of their chart.

## 2019-11-10 ENCOUNTER — Ambulatory Visit: Payer: Self-pay | Admitting: Pharmacist

## 2019-11-10 NOTE — Chronic Care Management (AMB) (Signed)
  Chronic Care Management   Note  11/10/2019 Name: Steve Chaney MRN: [id number] DOB: 14-Aug-1955  Steve Chaney is a 64 y.o. year old male who is a primary care patient of Dorcas Carrow, DO. The CCM team was consulted for assistance with chronic disease management and care coordination needs.    Contacted patient for medication access needs. Patient did answer, but noted that he was at work and unable to talk right now. Will collaborate w/ Care Guide to outreach next week to schedule a phone visit with me to discuss medication access.   Catie Feliz Beam, PharmD, [facility] Clinical Pharmacist [facility] Practice/[facility] [phone]

## 2019-11-13 ENCOUNTER — Telehealth: Payer: Self-pay | Admitting: Family Medicine

## 2019-11-13 NOTE — Chronic Care Management (AMB) (Signed)
  Care Management   Note  11/13/2019 Name: Kyan Yurkovich MRN: [id number] DOB: 04-Jun-1956  Danen Lapaglia is a 64 y.o. year old male who is a primary care patient of Dorcas Carrow, DO and is actively engaged with the care management team. I reached out to Leonarda Salon by phone today to assist with re-scheduling an initial visit with the Pharmacist  Follow up plan: Telephone appointment with care management team member scheduled for:12/05/2019  Penne Lash, RMA Care Guide, Embedded Care Coordination [facility]  [place], [place] [address] Direct Dial: [phone] [email or web address] Website: [email or web address]

## 2019-11-16 ENCOUNTER — Other Ambulatory Visit: Payer: Self-pay | Admitting: Family Medicine

## 2019-11-16 MED ORDER — ALBUTEROL SULFATE HFA 108 (90 BASE) MCG/ACT IN AERS: 2.0000 | INHALATION_SPRAY | Freq: Four times a day (QID) | RESPIRATORY_TRACT | 2 refills | Status: DC | PRN

## 2019-11-16 NOTE — Telephone Encounter (Signed)
Medication Refill - Medication: albuterol (VENTOLIN HFA) 108 (90 Base) MCG/ACT inhaler  Pt needs a refill for rescue inhaler as soon as possible.  Has the patient contacted their pharmacy? Yes.   (Agent: If no, request that the patient contact the pharmacy for the refill.) (Agent: If yes, when and what did the pharmacy advise?)  Preferred Pharmacy (with phone number or street name):  [facility] #[id number] - [place], [place] - [address] AT [facility] [address] Phone:  [phone]  Fax:  [phone]       Agent: Please be advised that RX refills may take up to 3 business days. We ask that you follow-up with your pharmacy.

## 2019-11-16 NOTE — Telephone Encounter (Signed)
Refills provided until appt on 02/15/20.

## 2019-11-24 ENCOUNTER — Telehealth: Payer: Self-pay | Admitting: Family Medicine

## 2019-11-24 NOTE — Telephone Encounter (Signed)
Patient requesting an early refill of albuterol (VENTOLIN HFA) 108 (90 Base) MCG/ACT inhaler .    Patient states he has been staying with roommates who smoke heavily so he has been taking more puffs than usual.    Pharmacy:  [facility] #[id number] - [place], [place] - [address] AT [facility] [address] Phone:  [phone]  Fax:  [phone]

## 2019-11-24 NOTE — Telephone Encounter (Signed)
Routing to provider  

## 2019-11-27 NOTE — Telephone Encounter (Signed)
I just sent him through a refill on that on 5/6. If he's used up an entire inhaler in 2 weeks, he's using too much. I want him to go back on his breo (which I sent through for him) and use his albuterol no more than every 2 hours

## 2019-11-27 NOTE — Telephone Encounter (Signed)
Patient notified

## 2019-12-04 ENCOUNTER — Other Ambulatory Visit: Payer: Self-pay | Admitting: Family Medicine

## 2019-12-04 MED ORDER — ALBUTEROL SULFATE HFA 108 (90 BASE) MCG/ACT IN AERS: 2.0000 | INHALATION_SPRAY | Freq: Four times a day (QID) | RESPIRATORY_TRACT | 0 refills | Status: DC | PRN

## 2019-12-04 NOTE — Telephone Encounter (Signed)
Routing to provider  

## 2019-12-04 NOTE — Telephone Encounter (Signed)
Called and lvm letting patient know Jessica's response.

## 2019-12-04 NOTE — Telephone Encounter (Signed)
Requested medication (s) are due for refill today - too soon  Requested medication (s) are on the active medication list -yes  Future visit scheduled -yes  Last refill: 11/16/19  Notes to clinic: Patient is requesting RF too soon- sent for review of request  Requested Prescriptions  Pending Prescriptions Disp Refills   albuterol (VENTOLIN HFA) 108 (90 Base) MCG/ACT inhaler 18 g 2    Sig: Inhale 2 puffs into the lungs every 6 (six) hours as needed for wheezing or shortness of breath.      Pulmonology:  Beta Agonists Failed - 12/04/2019  2:20 PM      Failed - One inhaler should last at least one month. If the patient is requesting refills earlier, contact the patient to check for uncontrolled symptoms.      Failed - Valid encounter within last 12 months    Recent Outpatient Visits           3 weeks ago Simple chronic bronchitis (HCC)   [facility] [place], Megan P, DO   1 year ago Procedure and treatment not carried out due to patient leaving prior to being seen by health care provider   [facility] Olevia Perches P, DO   1 year ago Compression fracture of T8 vertebra, initial encounter [facility])   [facility] [place], Megan P, DO   1 year ago Aortic embolism or thrombosis [facility])   [facility] [place], Megan P, DO   3 years ago Acute bilateral low back pain without sciatica   [facility] Dorcas Carrow, DO       Future Appointments             In 2 months Johnson, Megan P, DO [facility], [facility]                Requested Prescriptions  Pending Prescriptions Disp Refills   albuterol (VENTOLIN HFA) 108 (90 Base) MCG/ACT inhaler 18 g 2    Sig: Inhale 2 puffs into the lungs every 6 (six) hours as needed for wheezing or shortness of breath.      Pulmonology:  Beta Agonists Failed - 12/04/2019  2:20 PM      Failed - One inhaler should last at least one month. If the patient is requesting refills  earlier, contact the patient to check for uncontrolled symptoms.      Failed - Valid encounter within last 12 months    Recent Outpatient Visits           3 weeks ago Simple chronic bronchitis (HCC)   [facility] [place], Megan P, DO   1 year ago Procedure and treatment not carried out due to patient leaving prior to being seen by health care provider   [facility] Olevia Perches P, DO   1 year ago Compression fracture of T8 vertebra, initial encounter [facility])   [facility] [place], Megan P, DO   1 year ago Aortic embolism or thrombosis [facility])   [facility] [place], Megan P, DO   3 years ago Acute bilateral low back pain without sciatica   [facility] Dorcas Carrow, DO       Future Appointments             In 2 months Laural Benes, Oralia Rud, DO [facility], [facility]

## 2019-12-04 NOTE — Telephone Encounter (Signed)
RX REFILL albuterol (VENTOLIN HFA) 108 (90 Base) MCG/ACT inhaler [[id number]]  PHARMACY [facility] #[id number] - [place], [place] - [address] AT [facility] [address] Phone:  [phone]  Fax:  [phone]      Patient states he is leaving with people that smoke all the time and needs this asap. Call back [phone]

## 2019-12-05 ENCOUNTER — Other Ambulatory Visit: Payer: Self-pay | Admitting: Pharmacist

## 2019-12-05 ENCOUNTER — Ambulatory Visit: Payer: Self-pay | Admitting: Pharmacist

## 2019-12-05 DIAGNOSIS — J410 Simple chronic bronchitis: Secondary | ICD-10-CM

## 2019-12-05 DIAGNOSIS — I7410 Embolism and thrombosis of unspecified parts of aorta: Secondary | ICD-10-CM

## 2019-12-05 DIAGNOSIS — Z86711 Personal history of pulmonary embolism: Secondary | ICD-10-CM

## 2019-12-05 MED ORDER — FLUTICASONE-SALMETEROL 113-14 MCG/ACT IN AEPB: 1.0000 | INHALATION_SPRAY | Freq: Two times a day (BID) | RESPIRATORY_TRACT | 2 refills | Status: DC

## 2019-12-05 NOTE — Telephone Encounter (Signed)
Patient is uninsured, cannot afford Breo. Living and working in [employer].   There is a GoodRx coupon for generic Advair that [facility] As PCP is on vacation, discussed w/ covering provider. Will route to Fleet Contras [facility], then I will call the GoodRx card info to the pharmacy and notify patient

## 2019-12-05 NOTE — Telephone Encounter (Signed)
Fluticasone-Salmeterol 113-14 mcg/ ACT AEPB failed to transmit electronically; will resend to Provider to address.

## 2019-12-05 NOTE — Telephone Encounter (Signed)
Rx sent 

## 2019-12-05 NOTE — Chronic Care Management (AMB) (Signed)
Chronic Care Management   Note  12/05/2019 Name: Steve Chaney MRN: [id number] DOB: 05/10/1956   Subjective:  Steve Chaney is a 64 y.o. year old male who is a primary care patient of Valerie Roys, DO. The CCM team was consulted for assistance with chronic disease management and care coordination needs.     Contacted patient for medication access needs.   Review of patient status, including review of consultants reports, laboratory and other test data, was performed as part of comprehensive evaluation and provision of chronic care management services.   SDOH (Social Determinants of Health) assessments and interventions performed:  yes  Objective:  Lab Results  Component Value Date   CREATININE 0.85 09/05/2018   CREATININE 0.92 05/14/2016   CREATININE 0.69 03/13/2016    No results found for: HGBA1C  No results found for: CHOL, TRIG, HDL, CHOLHDL, VLDL, LDLCALC, LDLDIRECT  Clinical ASCVD: No  The ASCVD Risk score [name], et al., 2013) failed to calculate for the following reasons:   Cannot find a previous HDL lab   Cannot find a previous total cholesterol lab    BP Readings from Last 3 Encounters:  09/13/18 (!) 128/93  09/05/18 134/82  08/24/18 138/74    No Known Allergies  Medications Reviewed Today    Reviewed by Valerie Roys, DO (Physician) on 11/09/19 at 1359  Med List Status: <None>  Medication Order Taking? Sig Documenting Provider Last Dose Status Informant  albuterol (VENTOLIN HFA) 108 (90 Base) MCG/ACT inhaler [id number] Yes Inhale 2 puffs into the lungs every 6 (six) hours as needed for wheezing or shortness of breath. Johnson, Megan P, DO  Active   fluticasone furoate-vilanterol (BREO ELLIPTA) 100-25 MCG/INH AEPB [id number]  Inhale 1 puff into the lungs daily. Wynetta Emery, Megan P, DO  Active   ipratropium (ATROVENT) 0.02 % nebulizer solution [id number] Yes Inhale 1.25 mLs (0.25 mg total) into the lungs every 6 (six) hours as needed for wheezing or  shortness of breath. Johnson, Megan P, DO  Active   rivaroxaban (XARELTO) 20 MG TABS tablet [id number] Yes TAKE 1 TABLET(20 MG) BY MOUTH DAILY WITH SUPPER Wynetta Emery, Megan P, DO  Active            Assessment:   Goals Addressed            This Visit's Progress     Patient Stated   . PharmD "I can't afford my medications" (pt-stated)       CARE PLAN ENTRY (see longtitudinal plan of care for additional care plan information)  Current Barriers:  . Polypharmacy; complex patient with multiple comorbidities including COPD, depression, anxiety, hx PE . Poor housing situation right now. 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 Is uninsured. Living with two other men who drink/smoke/use illegal substances, per patient report, and is exacerbating his underlying breathing condition o COPD: spirometry in 2016 showed severe obstruction. 2 ED visits in April for exacerbation. Unable to afford prescription for Breo. Has used an entire albuterol cannister in ~2 weeks o Hx of arterial thrombosis, aortic embolism and hx of PE: on Xarelto 20 mg. Does not appear to have been worked up for hypercoagulable state, as patient has not kept appt for this. Receiving through [facility] and [place] patient assistance.   Pharmacist Clinical Goal(s):  Marland Kitchen Over the next 90 days, patient will work with PharmD and provider towards optimized medication management  Interventions: . Comprehensive medication review performed; medication list updated in  electronic medical record . Inter-disciplinary care team collaboration (see longitudinal plan of care) . Discussed that when he gets back to [place], we can utilize resources such as [facility] to support medication access. Would consider LABA/LAMA vs ICS/LABA, or triple therapy. However, most affordable option at this time is generic Advair Respiclick w/ [facility]. Will  collaborate w/ covering provider to send this today, as patient reports breathing concerns. [facility] information [facility], as well as texted to patient.  . Long term, patient should qualify for assistance programs, as he is uninsured. Will collaborate w/ PCP to determine what class we want to pursue.  . Interested in housing options in [place]. Placed referral for Care Guide  Patient Self Care Activities:  . Patient will take medications as prescribed  Initial goal documentation        Plan: - Scheduled f/u in ~ 8 weeks  Catie Feliz Beam, PharmD, [facility] Clinical Pharmacist [facility] Practice/[facility] [phone]

## 2019-12-05 NOTE — Telephone Encounter (Signed)
[facility] [address] [facility]

## 2019-12-05 NOTE — Telephone Encounter (Signed)
Please advise 

## 2019-12-05 NOTE — Patient Instructions (Signed)
Visit Information  Goals Addressed            This Visit's Progress     Patient Stated   . PharmD "I can't afford my medications" (pt-stated)       CARE PLAN ENTRY (see longtitudinal plan of care for additional care plan information)  Current Barriers:  . Polypharmacy; complex patient with multiple comorbidities including COPD, depression, anxiety, hx PE . Poor housing situation right now. 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 Living with two other men who drink/smoke/use illegal substances, per patient report, and is exacerbating his underlying breathing condition o COPD: spirometry in 2016 showed severe obstruction. 2 ED visits in April for exacerbation. Unable to afford prescription for Breo. Has used an entire albuterol cannister in ~2 weeks o Hx of arterial thrombosis, aortic embolism and hx of PE: on Xarelto 20 mg. Does not appear to have been worked up for hypercoagulable state, as patient has not kept appt for this. Receiving through [facility] and [place] patient assistance.   Pharmacist Clinical Goal(s):  Marland Kitchen Over the next 90 days, patient will work with PharmD and provider towards optimized medication management  Interventions: . Comprehensive medication review performed; medication list updated in electronic medical record . Inter-disciplinary care team collaboration (see longitudinal plan of care) . Discussed that when he gets back to [place], we can utilize resources such as Medication Management Clinic to support medication access. Would consider LABA/LAMA vs ICS/LABA, or triple therapy. However, most affordable option at this time is generic Advair Respiclick w/ [facility]. Will collaborate w/ covering provider to send this today, as patient reports breathing concerns. [facility] information [facility], as well as texted to patient.  . Long term, patient should qualify for  assistance programs, as he is uninsured. Will collaborate w/ PCP to determine what class we want to pursue.  . Interested in housing options in [place]. Placed referral for Care Guide  Patient Self Care Activities:  . Patient will take medications as prescribed  Initial goal documentation        Patient verbalizes understanding of instructions provided today.  Plan: - Scheduled f/u in ~ 8 weeks  Catie Feliz Beam, PharmD, [facility] Clinical Pharmacist [facility] Practice/[facility] [phone]

## 2020-01-02 ENCOUNTER — Telehealth: Payer: Self-pay | Admitting: Family Medicine

## 2020-01-02 NOTE — Telephone Encounter (Signed)
Medication Refill - Medication:  albuterol (VENTOLIN HFA) 108 (90 Base) MCG/ACT inhaler [[id number]]  He stated is comple  Has the patient contacted their pharmacy? No  (Agent: If no, request that the patient contact the pharmacy for the refill.) (Agent: If yes, when and what did the pharmacy advise?)  Preferred Pharmacy (with phone number or street name): [facility] [place]  Agent: Please be advised that RX refills may take up to 3 business days. We ask that you follow-up with your pharmacy.

## 2020-01-02 NOTE — Telephone Encounter (Signed)
°  Community Resource Referral   KNB 01/02/2020   1st Attempt   DOB: February 02, 1956    AGE: 64 y.o.    GENDER: male    PCP Olevia Perches P, DO.   Called pt regarding Community Resource Referral LMTCB Follow up on: 01/03/2020 Manuela Schwartz  Care Guide  Embedded Care Coordination [facility] Management Samara [email or web address]   [phone]

## 2020-01-04 ENCOUNTER — Other Ambulatory Visit: Payer: Self-pay | Admitting: Family Medicine

## 2020-01-04 MED ORDER — ALBUTEROL SULFATE HFA 108 (90 BASE) MCG/ACT IN AERS: 2.0000 | INHALATION_SPRAY | Freq: Four times a day (QID) | RESPIRATORY_TRACT | 3 refills | Status: DC | PRN

## 2020-01-04 NOTE — Telephone Encounter (Signed)
Called and left a message asking patient to call me with the pharmacy location.

## 2020-01-04 NOTE — Telephone Encounter (Signed)
  Community Resource Referral   KNB 01/04/2020  2nd Attempt  DOB: 10-04-1955   AGE: 64 y.o.   GENDER: male   PCP Olevia Perches P, DO.   Called pt regarding Community Resource Referral LMTCB Follow up on: 01/05/2020 Manuela Schwartz  Care Guide . Embedded Care Coordination [facility] Management [email or web address] [email or web address]  [phone]

## 2020-01-04 NOTE — Telephone Encounter (Signed)
Email to pt  From: Manuela Schwartz [facility])  Sent: Thursday, January 04, 2020 11:47 AM To: [email or web address] Subject: SECURE: [place] Housing Resources  Good Afternoon [name], Please see attached list of housing resources in [place].   Please contact [facility] if you need anything further,    Manuela Schwartz  Care Guide . Embedded Care Coordination [facility] Management Samara [email or web address]  [phone]

## 2020-01-04 NOTE — Telephone Encounter (Signed)
What [facility] in [place]? Please input pharmacy. Med is pended.

## 2020-01-04 NOTE — Telephone Encounter (Signed)
[name] just called me back regarding CRR and confirmed the [facility] Address [email or web address]/maps/dir//[phone],-[phone]/@[phone],-[phone],18.76z   [address] [place], [place] [address]  [name] [phone]

## 2020-01-04 NOTE — Telephone Encounter (Signed)
Pt called and is requesting to have this inhaler sent in for him. Pt states that he is needing to have this sent over today because he is not able to work. Pt also states that he is needing to speak with Carman Ching. Please advise.

## 2020-01-04 NOTE — Telephone Encounter (Signed)
  Community Resource Referral   KNB 01/04/2020  pt returned call  DOB: 1955-08-31   AGE: 64 y.o.   GENDER: male   PCP Olevia Perches P, DO.   Pt returned call and provided [facility]ill sent to Tiffany R. And Dr. Laural Benes  Discussed housing needs pt has had car issues and is living in [place]. Would eventually like to move back to [facility] or [facility] [place] [facility]. Asked that I send him email with resources.  Closing referral pending any other needs of patient. KNB  Manuela Schwartz  Care Guide . Embedded Care Coordination [facility] Management Samara [email or web address]  [phone]

## 2020-01-04 NOTE — Telephone Encounter (Signed)
Sending to provider

## 2020-02-02 ENCOUNTER — Ambulatory Visit: Payer: Self-pay | Admitting: Pharmacist

## 2020-02-02 DIAGNOSIS — I749 Embolism and thrombosis of unspecified artery: Secondary | ICD-10-CM

## 2020-02-02 DIAGNOSIS — J410 Simple chronic bronchitis: Secondary | ICD-10-CM

## 2020-02-02 NOTE — Patient Instructions (Signed)
Visit Information  Goals Addressed              This Visit's Progress     Patient Stated   .  PharmD "I can't afford my medications" (pt-stated)        CARE PLAN ENTRY (see longtitudinal plan of care for additional care plan information)  Current Barriers:  . Social, financial, community barriers:  o Poor housing situation right now. 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  o Received housing assistance information from Care Guide . Polypharmacy; complex patient with multiple comorbidities including COPD, depression, anxiety, hx PE o COPD: Fluticasone/salmeterol 113/14 mcg BID, albuterol HFA PRN. Reports his breathing has been well controlled lately o Hx of arterial thrombosis, aortic embolism and hx of PE: on Xarelto 20 mg. Patient reports he is due to reapply for Xarelto assistance, is not sure where to start  Pharmacist Clinical Goal(s):  Marland Kitchen Over the next 90 days, patient will work with PharmD and provider towards optimized medication management  Interventions: . Comprehensive medication review performed; medication list updated in electronic medical record . Inter-disciplinary care team collaboration (see longitudinal plan of care) . Will collaborate w/ PCP to sign Xarelto application. Faxing entire application to patient for him to complete patient portion, include his income information, then submit to [employer].   Patient Self Care Activities:  . Patient will take medications as prescribed  Please see past updates related to this goal by clicking on the "Past Updates" button in the selected goal         The patient verbalized understanding of instructions provided today and declined a print copy of patient instruction materials.   Plan:  - No other medication needs identified at this time. Patient has clinic contact information for future questions or concerns  Catie Feliz Beam,  PharmD, [facility] Clinical Pharmacist [facility] Practice/[facility] [phone]

## 2020-02-02 NOTE — Chronic Care Management (AMB) (Signed)
Chronic Care Management   Follow Up Note   02/02/2020 Name: Hezikiah Retzloff MRN: [id number] DOB: [birth year]  Referred by: Dorcas Carrow, DO Reason for referral : Chronic Care Management (Medication Management)   Kaizen Ibsen is a 64 y.o. year old male who is a primary care patient of Dorcas Carrow, DO. The CCM team was consulted for assistance with chronic disease management and care coordination needs.    Contacted patient for medication management review.  Review of patient status, including review of consultants reports, relevant laboratory and other test results, and collaboration with appropriate care team members and the patient's provider was performed as part of comprehensive patient evaluation and provision of chronic care management services.    SDOH (Social Determinants of Health) assessments performed: Yes See Care Plan activities for detailed interventions related to SDOH)  SDOH Interventions     Most Recent Value  SDOH Interventions  Financial Strain Interventions Other (Comment)  [reappling for medication assistance]       Outpatient Encounter Medications as of 02/02/2020  Medication Sig Note  . albuterol (VENTOLIN HFA) 108 (90 Base) MCG/ACT inhaler Inhale 2 puffs into the lungs every 6 (six) hours as needed for wheezing or shortness of breath. 02/02/2020: Using ~0-2 times daily   . Fluticasone-Salmeterol 113-14 MCG/ACT AEPB Inhale 1 puff into the lungs in the morning and at bedtime.   Marland Kitchen ipratropium (ATROVENT) 0.02 % nebulizer solution Inhale 1.25 mLs (0.25 mg total) into the lungs every 6 (six) hours as needed for wheezing or shortness of breath.   . rivaroxaban (XARELTO) 20 MG TABS tablet TAKE 1 TABLET(20 MG) BY MOUTH DAILY WITH SUPPER   . [DISCONTINUED] fluticasone furoate-vilanterol (BREO ELLIPTA) 100-25 MCG/INH AEPB Inhale 1 puff into the lungs daily.    No facility-administered encounter medications on file as of 02/02/2020.     Objective:   Goals  Addressed              This Visit's Progress     Patient Stated   .  PharmD "I can't afford my medications" (pt-stated)        CARE PLAN ENTRY (see longtitudinal plan of care for additional care plan information)  Current Barriers:  . Social, financial, community barriers:  o Poor housing situation right now. 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  o Received housing assistance information from Care Guide . Polypharmacy; complex patient with multiple comorbidities including COPD, depression, anxiety, hx PE o COPD: Fluticasone/salmeterol 113/14 mcg BID, albuterol HFA PRN. Reports his breathing has been well controlled lately o Hx of arterial thrombosis, aortic embolism and hx of PE: on Xarelto 20 mg. Patient reports he is due to reapply for Xarelto assistance, is not sure where to start  Pharmacist Clinical Goal(s):  Marland Kitchen Over the next 90 days, patient will work with PharmD and provider towards optimized medication management  Interventions: . Comprehensive medication review performed; medication list updated in electronic medical record . Inter-disciplinary care team collaboration (see longitudinal plan of care) . Will collaborate w/ PCP to sign Xarelto application. Faxing entire application to patient for him to complete patient portion, include his income information, then submit to [employer].   Patient Self Care Activities:  . Patient will take medications as prescribed  Please see past updates related to this goal by clicking on the "Past Updates" button in the selected goal          Plan:  -  No other medication needs identified at this time. Patient has clinic contact information for future questions or concerns  Catie Feliz Beam, PharmD, [facility] Clinical Pharmacist [facility] [phone]

## 2020-02-15 ENCOUNTER — Ambulatory Visit: Payer: Self-pay | Admitting: Family Medicine

## 2020-02-21 ENCOUNTER — Other Ambulatory Visit: Payer: Self-pay | Admitting: Family Medicine

## 2020-02-21 ENCOUNTER — Telehealth: Payer: Self-pay | Admitting: *Deleted

## 2020-02-21 NOTE — Telephone Encounter (Signed)
albuterol (VENTOLIN HFA) 108 (90 Base) MCG/ACT inhaler    Patient has lost this medication and is requesting a refill. Patient wanted to note that he is living in his vehicle until the end of the month. He states he will schedule appointment soon.    Pharmacy:  [id number] - [address] [place] - [address] AT [facility] [address] Phone:  [phone]  Fax:  [phone]

## 2020-02-21 NOTE — Telephone Encounter (Signed)
See message below °

## 2020-02-21 NOTE — Chronic Care Management (AMB) (Signed)
  Care Management   Note  02/21/2020 Name: Lan Mcneill MRN: [id number] DOB: [birth year]  Kani Chauvin is a 64 y.o. year old male who is a primary care patient of Dorcas Carrow, DO and is actively engaged with the care management team. I reached out to Leonarda Salon by phone today to assist with scheduling a follow up visit with the Pharmacist.  Patient called in today and left a message for Pharmacist to call him. Returned call to patient, patient is in need of a new inhaler as he lost his current inhaler do to becoming homeless. Asked patient if he would like for me to have community resources outreach and he declined assistance at this time. Called office had patient put back a message to Triage for [facility] [place], Megan P, DO, to assist in getting a new inhaler.  Messaged Pharmacist Boykin Nearing and Dickie La LCSW and made them aware of problem and schedule FU call with PharmD.   Follow up plan: Telephone appointment with care management team member scheduled for: 03/08/2020  [facility] Guide, Embedded Care Coordination [facility]  [place], [place] [address] Direct Dial: [phone] [email or web address] Website: [email or web address]

## 2020-02-21 NOTE — Telephone Encounter (Signed)
Requested medication (s) are due for refill today: no  Requested medication (s) are on the active medication list: yes  Future visit scheduled: no  Notes to clinic:   is requesting a refill. Patient wanted to note that he is living in his vehicle until the end of the month. He states he will schedule appointment soon.    Requested Prescriptions  Pending Prescriptions Disp Refills   albuterol (VENTOLIN HFA) 108 (90 Base) MCG/ACT inhaler 18 g 3    Sig: Inhale 2 puffs into the lungs every 6 (six) hours as needed for wheezing or shortness of breath.      Pulmonology:  Beta Agonists Failed - 02/21/2020 11:11 AM      Failed - One inhaler should last at least one month. If the patient is requesting refills earlier, contact the patient to check for uncontrolled symptoms.      Failed - Valid encounter within last 12 months    Recent Outpatient Visits           3 months ago Simple chronic bronchitis (HCC)   [facility] [place], Megan P, DO   1 year ago Procedure and treatment not carried out due to patient leaving prior to being seen by health care provider   [facility] Olevia Perches P, DO   1 year ago Compression fracture of T8 vertebra, initial encounter [facility])   [facility] [place], Megan P, DO   1 year ago Aortic embolism or thrombosis [facility])   [facility] [place], Megan P, DO   3 years ago Acute bilateral low back pain without sciatica   [facility] [place], [place], DO

## 2020-02-23 ENCOUNTER — Telehealth: Payer: Self-pay | Admitting: *Deleted

## 2020-02-23 NOTE — Chronic Care Management (AMB) (Signed)
  Care Management   Note  02/23/2020 Name: Steve Chaney MRN: [id number] DOB: [birth year]  Steve Chaney is a 64 y.o. year old male who is a primary care patient of Dorcas Carrow, DO and is actively engaged with the care management team. I reached out to Leonarda Salon by phone today to assist with scheduling an initial visit with the Licensed Clinical Social Worker  Follow up plan: Unsuccessful telephone outreach attempt made. A HIPPA compliant phone message was left for the patient providing contact information and requesting a return call. The care management team will reach out to the patient again over the next 7 days. If patient returns call to provider office, please advise to call Embedded Care Management Care Guide Gwenevere Ghazi at [phone].  Gwenevere Ghazi  Care Guide, Embedded Care Coordination [facility]  [place], [place] [address] Direct Dial: [phone] [email or web address] Website: [email or web address]

## 2020-02-27 MED ORDER — ALBUTEROL SULFATE HFA 108 (90 BASE) MCG/ACT IN AERS: 2.0000 | INHALATION_SPRAY | Freq: Four times a day (QID) | RESPIRATORY_TRACT | 3 refills | Status: DC | PRN

## 2020-03-04 NOTE — Chronic Care Management (AMB) (Signed)
  Care Management   Note  03/04/2020 Name: Steve Chaney MRN: [id number] DOB: Aug 17, 1955  [name] is a 64 y.o. year old male who is a primary care patient of Dorcas Carrow, DO and is actively engaged with the care management team. I reached out to Leonarda Salon by phone today to assist with scheduling an initial visit with the Licensed Clinical Social Worker.  Follow up plan: Unsuccessful telephone outreach attempt made. A HIPPA compliant phone message was left for the patient providing contact information and requesting a return call. The care management team will reach out to the patient again over the next 7 days. If patient returns call to provider office, please advise to call Embedded Care Management Care Guide Gwenevere Ghazi at [phone].  Gwenevere Ghazi  Care Guide, Embedded Care Coordination [facility]  [place], [place] [address] Direct Dial: [phone] [email or web address] Website: [email or web address]

## 2020-03-08 ENCOUNTER — Telehealth: Payer: Self-pay

## 2020-03-08 ENCOUNTER — Telehealth: Payer: Self-pay | Admitting: Pharmacist

## 2020-03-08 NOTE — Progress Notes (Signed)
  Chronic Care Management   Outreach Note  03/08/2020 Name: Steve Chaney MRN: [id number] DOB: February 08, 1956  Referred by: Dorcas Carrow, DO Reason for referral : No chief complaint on file.   An unsuccessful telephone outreach was attempted today. The patient was referred to the pharmacist for assistance with care management and care coordination.  Left HIPAA compliant message  to return my call at his convenience.     Mercer Pod. Tiburcio Pea PharmD, BCPS Clinical Pharmacist [facility] [phone]

## 2020-03-11 NOTE — Chronic Care Management (AMB) (Signed)
  Care Management   Note  03/11/2020 Name: Steve Chaney MRN: [id number] DOB: 01/12/1956  Steve Chaney is a 64 y.o. year old male who is a primary care patient of Dorcas Carrow, DO and is actively engaged with the care management team. I reached out to Leonarda Salon by phone today to assist with scheduling an initial visit with the Licensed Clinical Social Worker.  Follow up plan: A third  telephone outreach attempt made spoke with patient he was in a hurry and could not talk. He stated that he did not need to speak to care manager about housing that he had found a place to live. Patient would like for Pharmacist to call him to discuss medications. He did not give me time to schedule a FU with PharmD he hung up because he was in a rush. Amber to reschedule missed FU call with PharmD on 03/08/2020. The patient has been provided with contact information for the care management team and has been advised to call with any health related questions or concerns.   Gwenevere Ghazi  Care Guide, Embedded Care Coordination [facility]  [place], [place] [address] Direct Dial: [phone] [email or web address] Website: [email or web address]

## 2020-03-13 ENCOUNTER — Telehealth: Payer: Self-pay

## 2020-03-13 NOTE — Chronic Care Management (AMB) (Signed)
°  Care Management   Note  03/13/2020 Name: Steve Chaney MRN: [id number] DOB: 04-07-1956  Steve Chaney is a 64 y.o. year old male who is a primary care patient of Dorcas Carrow, DO and is actively engaged with the care management team. I reached out to Leonarda Salon by phone today to assist with re-scheduling a follow up visit with the Pharmacist  Follow up plan: Patient declines further follow up at this time with Pharm D, pt states that he will call if needs any assistance    Penne Lash, RMA Care Guide, Embedded Care Coordination [facility]  [place], [place] [address] Direct Dial: [phone] [email or web address] Website: [email or web address]

## 2020-03-13 NOTE — Telephone Encounter (Signed)
Roxana Hires I reached out to patient to r/s and he does not feel he needs to schedule a f/u at this time. Pt states that he has his inhaler and refills will let you know if he needs anything   Thank you

## 2020-03-14 ENCOUNTER — Telehealth: Payer: Self-pay | Admitting: Family Medicine

## 2020-03-14 NOTE — Telephone Encounter (Signed)
Copied from CRM [id number]. Topic: General - Other >> Mar 14, 2020  3:59 PM Jaquita Rector A wrote: Reason for CRM: Patient called to ask Raynelle Fanning the pharmacist to give him a call by 10 AM on 03/15/20. Ph# [phone]

## 2020-03-14 NOTE — Telephone Encounter (Signed)
My schedule is full tomorrow so that won't be possible. If patient calls back and would like to be rescheduled I will ask the care guides to reach out for reschedule.

## 2020-03-15 NOTE — Telephone Encounter (Signed)
Called pt no answer, left vm  

## 2020-03-15 NOTE — Telephone Encounter (Signed)
Pt called and is requesting to speak with Raynelle Fanning. He states that he is needing to have Xarelto and that he has not had any in months. Please advise.

## 2020-03-21 NOTE — Telephone Encounter (Signed)
Gillian Shields, Would you please call Mr. Capell tomorrow around 10:30 am to assist with financial barriers to Xarelto therapy?  Thank you, Raynelle Fanning

## 2020-03-21 NOTE — Telephone Encounter (Signed)
Pt would like a call tomorrow around 10:30AM if possible.

## 2020-03-26 NOTE — Chronic Care Management (AMB) (Signed)
  Care Management   Note  03/26/2020 Name: Steve Chaney MRN: [id number] DOB: 1956/04/14  Steve Chaney is a 64 y.o. year old male who is a primary care patient of Dorcas Carrow, DO and is actively engaged with the care management team. I reached out to Leonarda Salon by phone today to assist with re-scheduling a follow up visit with the Pharmacist  Follow up plan: Patient declines further follow up and engagement by the care management team. Appropriate care team members and provider have been notified via electronic communication.   Penne Lash, RMA Care Guide, Embedded Care Coordination [facility]  [place], [place] [address] Direct Dial: [phone] [email or web address]: [email or web address]

## 2020-04-01 ENCOUNTER — Telehealth: Payer: Self-pay | Admitting: Pharmacist

## 2020-04-01 NOTE — Progress Notes (Signed)
    Chronic Care Management Pharmacy Assistant   Name: Steve Chaney  MRN: [id number] DOB: 01-09-1956  Reason for Encounter: Patient Assistance Application   PCP : Dorcas Carrow, DO  Allergies:  No Known Allergies  Medications: Outpatient Encounter Medications as of 04/01/2020  Medication Sig  . albuterol (VENTOLIN HFA) 108 (90 Base) MCG/ACT inhaler Inhale 2 puffs into the lungs every 6 (six) hours as needed for wheezing or shortness of breath.  . Fluticasone-Salmeterol 113-14 MCG/ACT AEPB Inhale 1 puff into the lungs in the morning and at bedtime.  Marland Kitchen ipratropium (ATROVENT) 0.02 % nebulizer solution Inhale 1.25 mLs (0.25 mg total) into the lungs every 6 (six) hours as needed for wheezing or shortness of breath.  . rivaroxaban (XARELTO) 20 MG TABS tablet TAKE 1 TABLET(20 MG) BY MOUTH DAILY WITH SUPPER   No facility-administered encounter medications on file as of 04/01/2020.    Current Diagnosis: Patient Active Problem List   Diagnosis Date Noted  . Compression fracture of T8 vertebra (HCC) 09/05/2018  . History of pulmonary embolus (PE) 08/08/2018  . Status post right inguinal hernia repair 05/28/2016  . Depression 08/05/2015  . COPD (chronic obstructive pulmonary disease) (HCC) 05/13/2015  . 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 Alcohol use 01/09/2014  . 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 History of embolectomy 01/09/2014  . Tobacco use 01/01/2014    Patient assistance application completed and in pharmacist Boykin Nearing folder to be mailed to patient. Spoke with patient ,he is currently in [place] and would like for [place] to mail paper work to him in [place].   Address to where to mail application is - [address] [place] , [place] [address]   Informed patient to mail back application to office [facility]) C/o Raynelle Fanning harris once complete.   Aloha Gell ,CMA Clinical Pharmacist  Assistant [phone]      Follow-Up:  Patient Assistance Coordination

## 2020-04-02 ENCOUNTER — Telehealth: Payer: Self-pay | Admitting: Pharmacist

## 2020-04-12 IMAGING — CR DG THORACIC SPINE 2V
3 series · 3 of 3 positions shown · non-contrast
Comparison: 10/03/2016.

CLINICAL DATA: Back pain following a twisting injury. Chronic
cough. Smoker.

EXAM:
THORACIC SPINE 2 VIEWS

[t-spine lat]
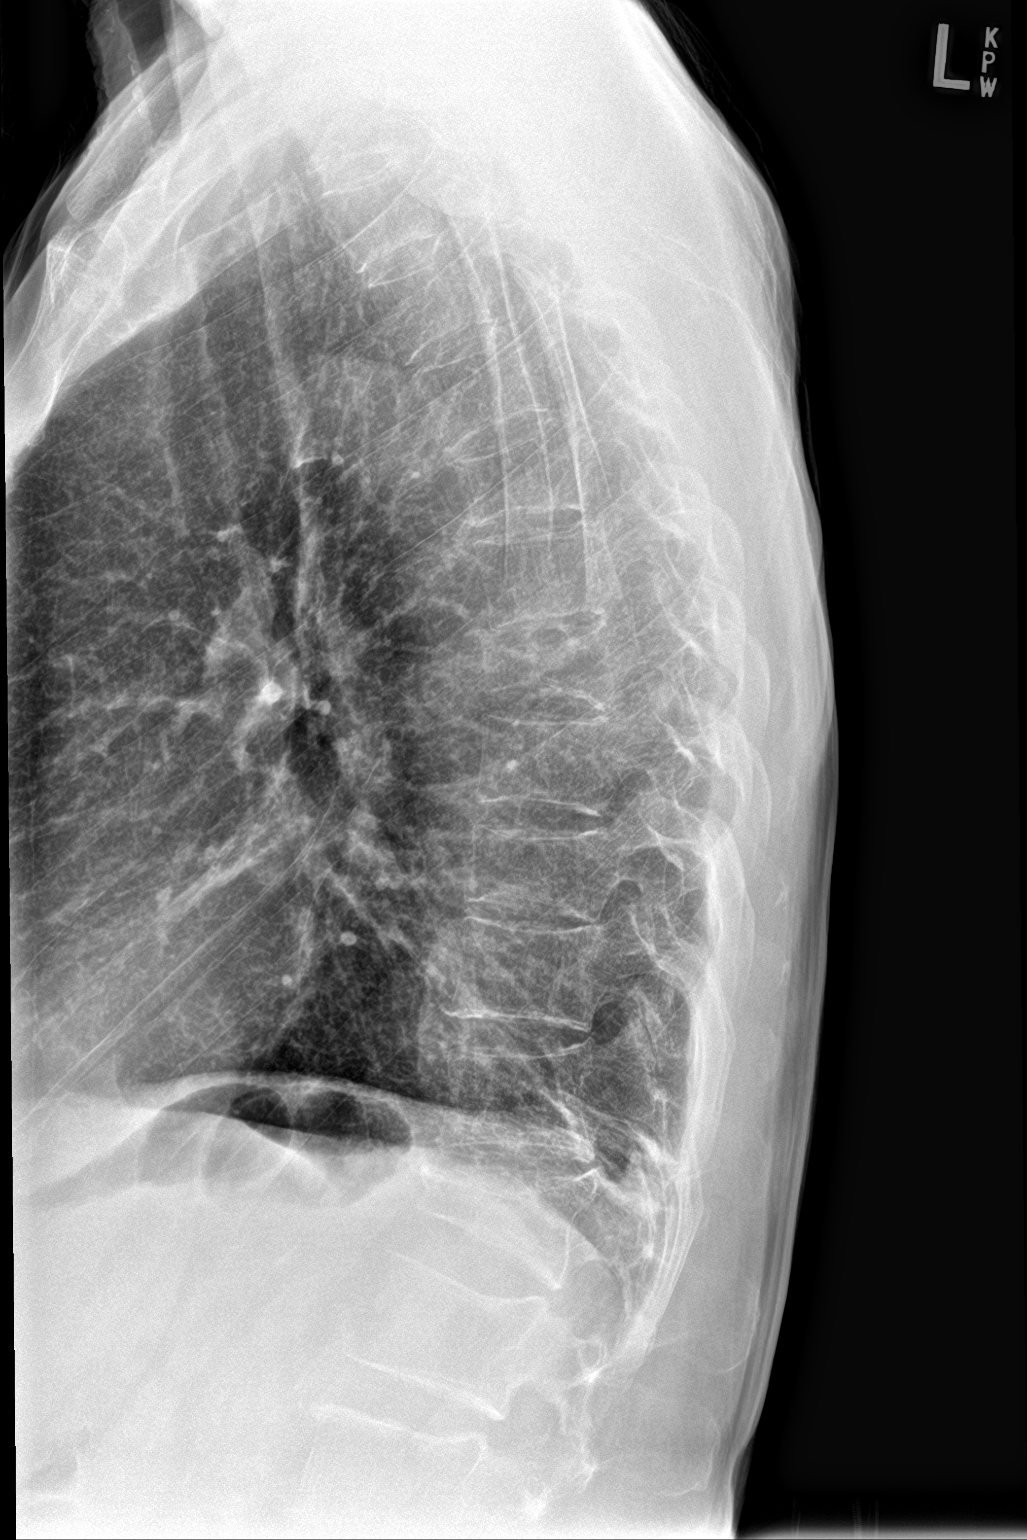

[t-spine swimmers]
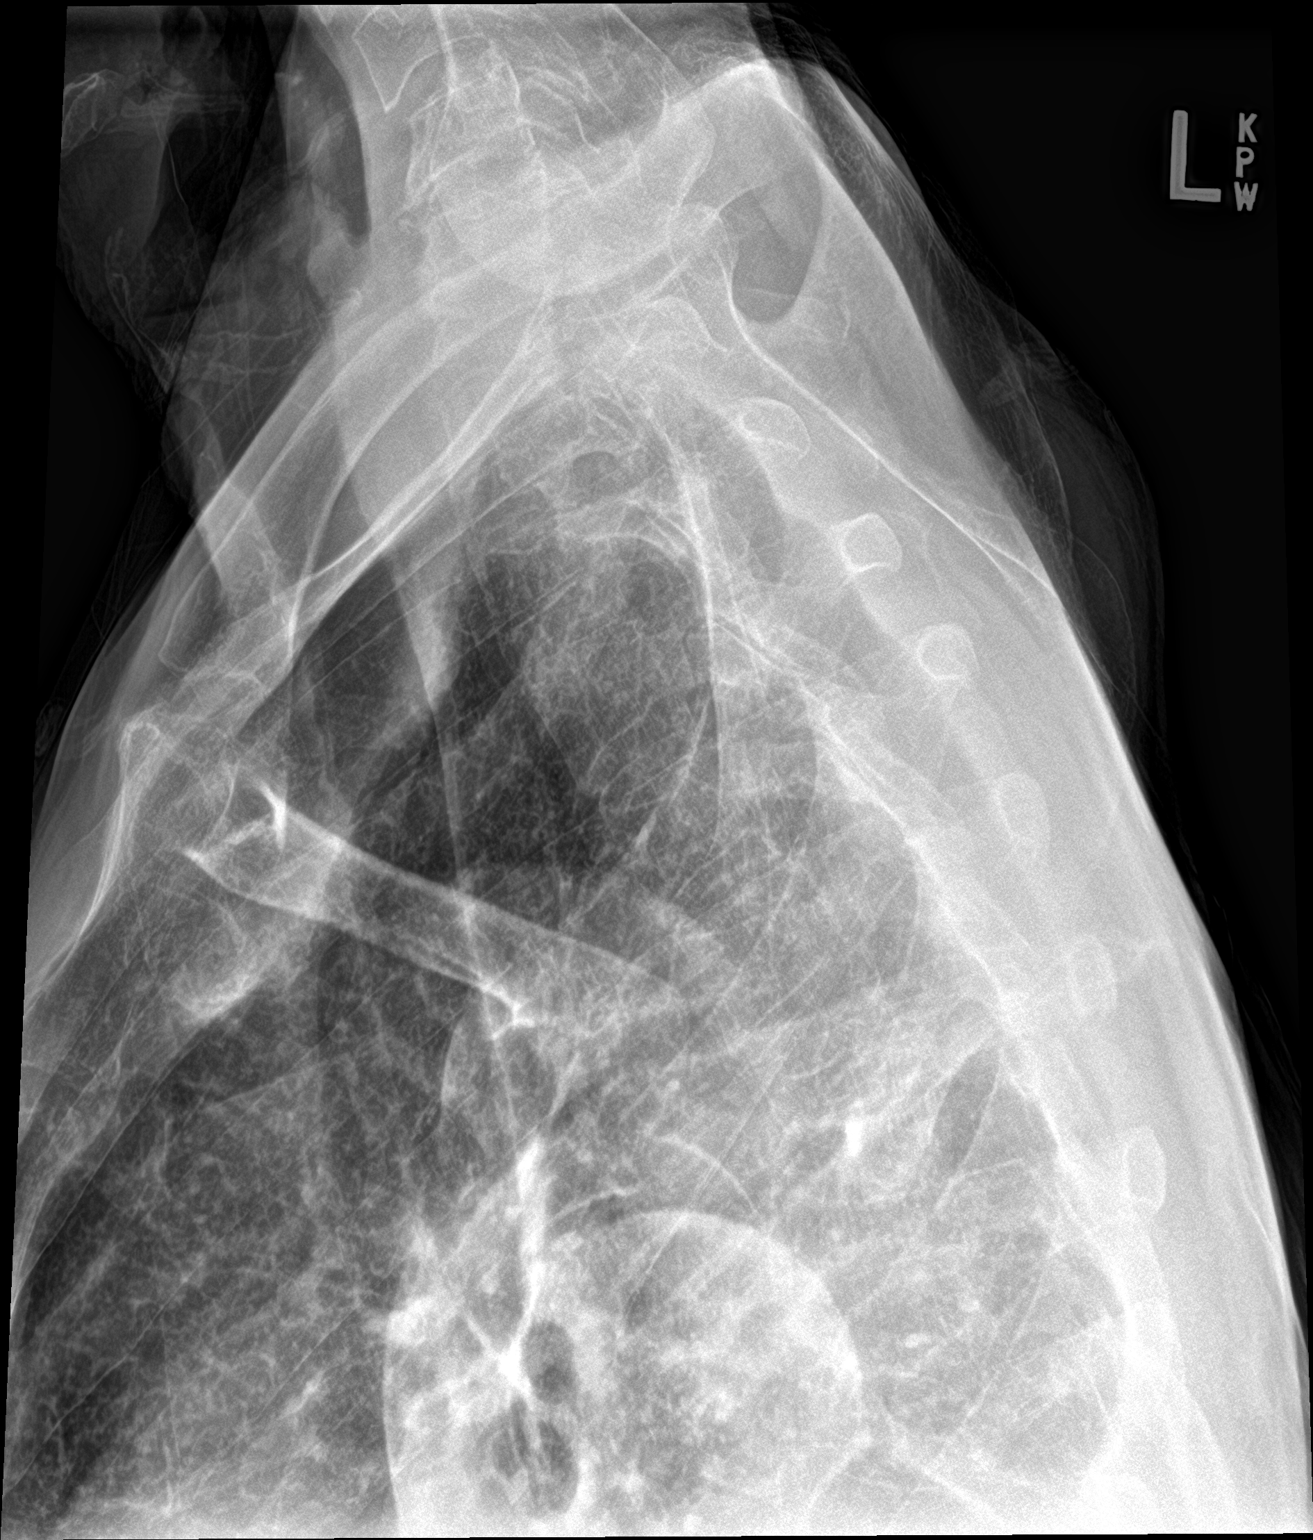

[t-spine ap]
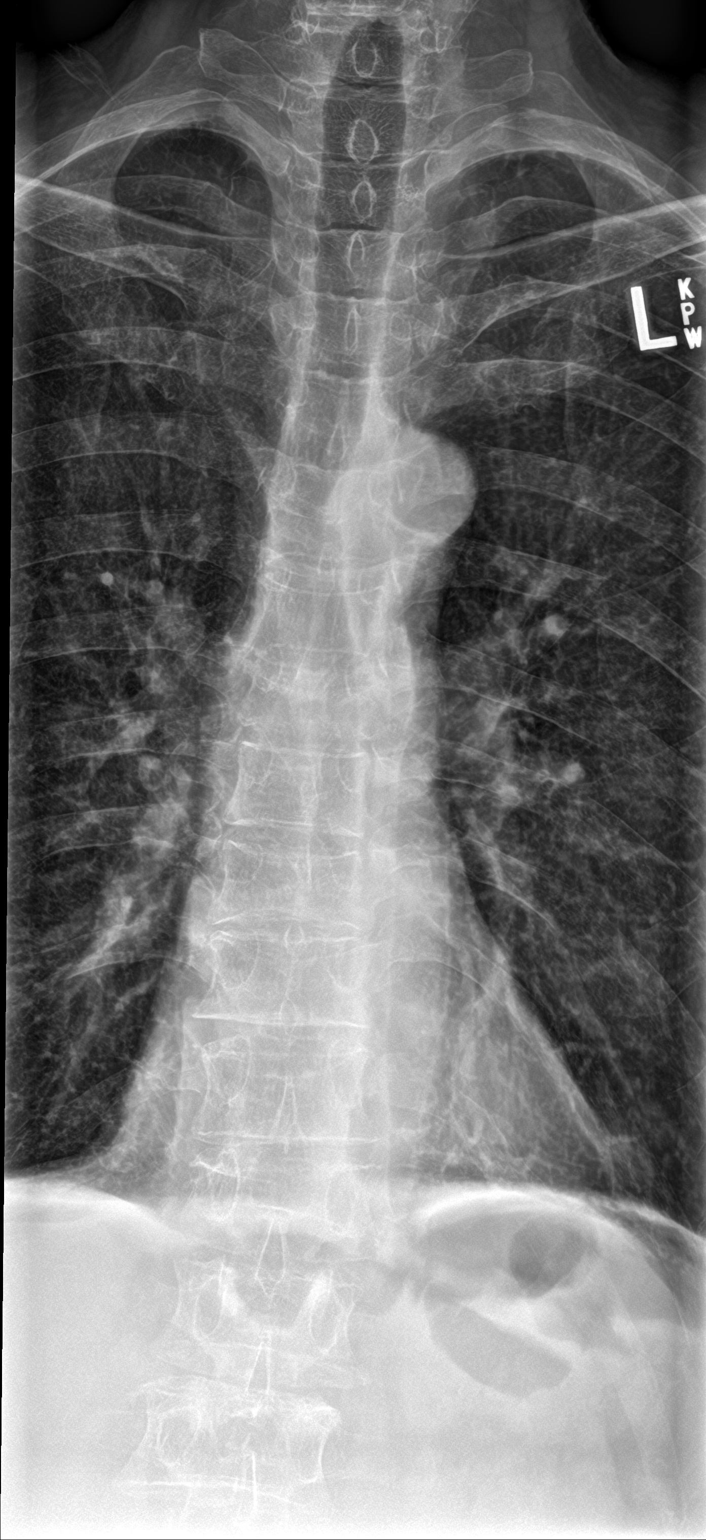

[3 of 3 positions shown; findings below may reference images not displayed]

FINDINGS: Twelve rib-bearing thoracic vertebrae.

Approximately 20% T6 vertebral compression deformity without
significant change and no visible acute fracture lines.

Approximately 60% interval T8 vertebral compression deformity with
mild bony retropulsion and possible acute fracture lines.

Approximately 25% T10 vertebral compression deformity without
significant change.

Approximately 60% L1 vertebral compression deformity, not previously
included. No visible acute fracture lines. Mild to moderate bony
retropulsion superiorly.

No subluxations. Diffuse osteopenia. Lower cervical spine
degenerative changes.
IMPRESSION: 1. Interval approximately 60% T8 vertebral compression deformity
with mild bony retropulsion and possible acute fracture lines.
2. Approximately 60% L1 vertebral compression deformity, age
indeterminate, with no visible fracture lines.
3. Stable old T6 and T10 vertebral compression deformities.

## 2020-04-12 IMAGING — CR DG CHEST 2V
2 series · 2 of 2 positions shown · non-contrast
Comparison: 01/14/2006. Thoracic spine radiographs obtained today
and on 10/03/2016.

CLINICAL DATA: Left mid back pain following a twisting injury.
Smoker.

EXAM:
CHEST - 2 VIEW

[chest pa]
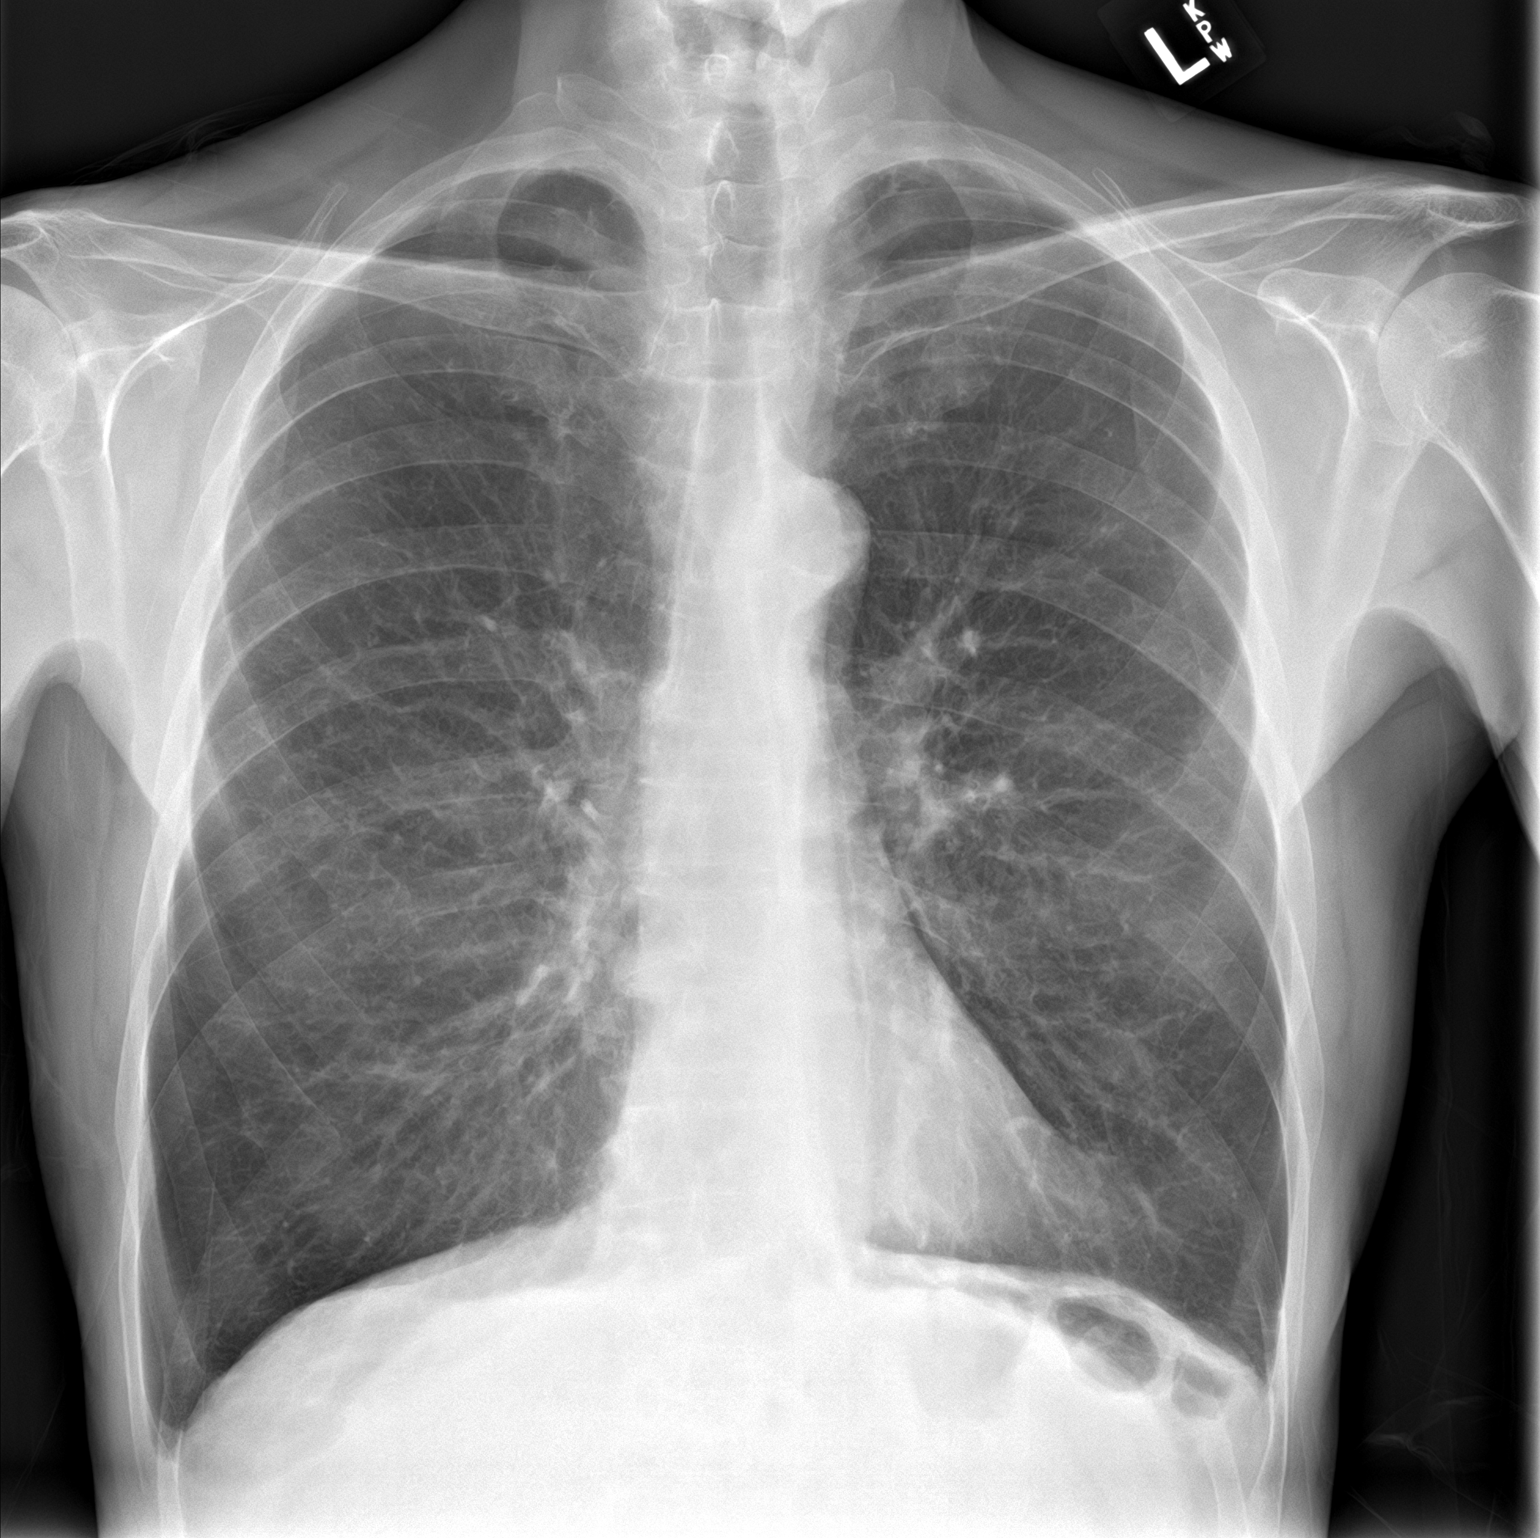

[chest lat]
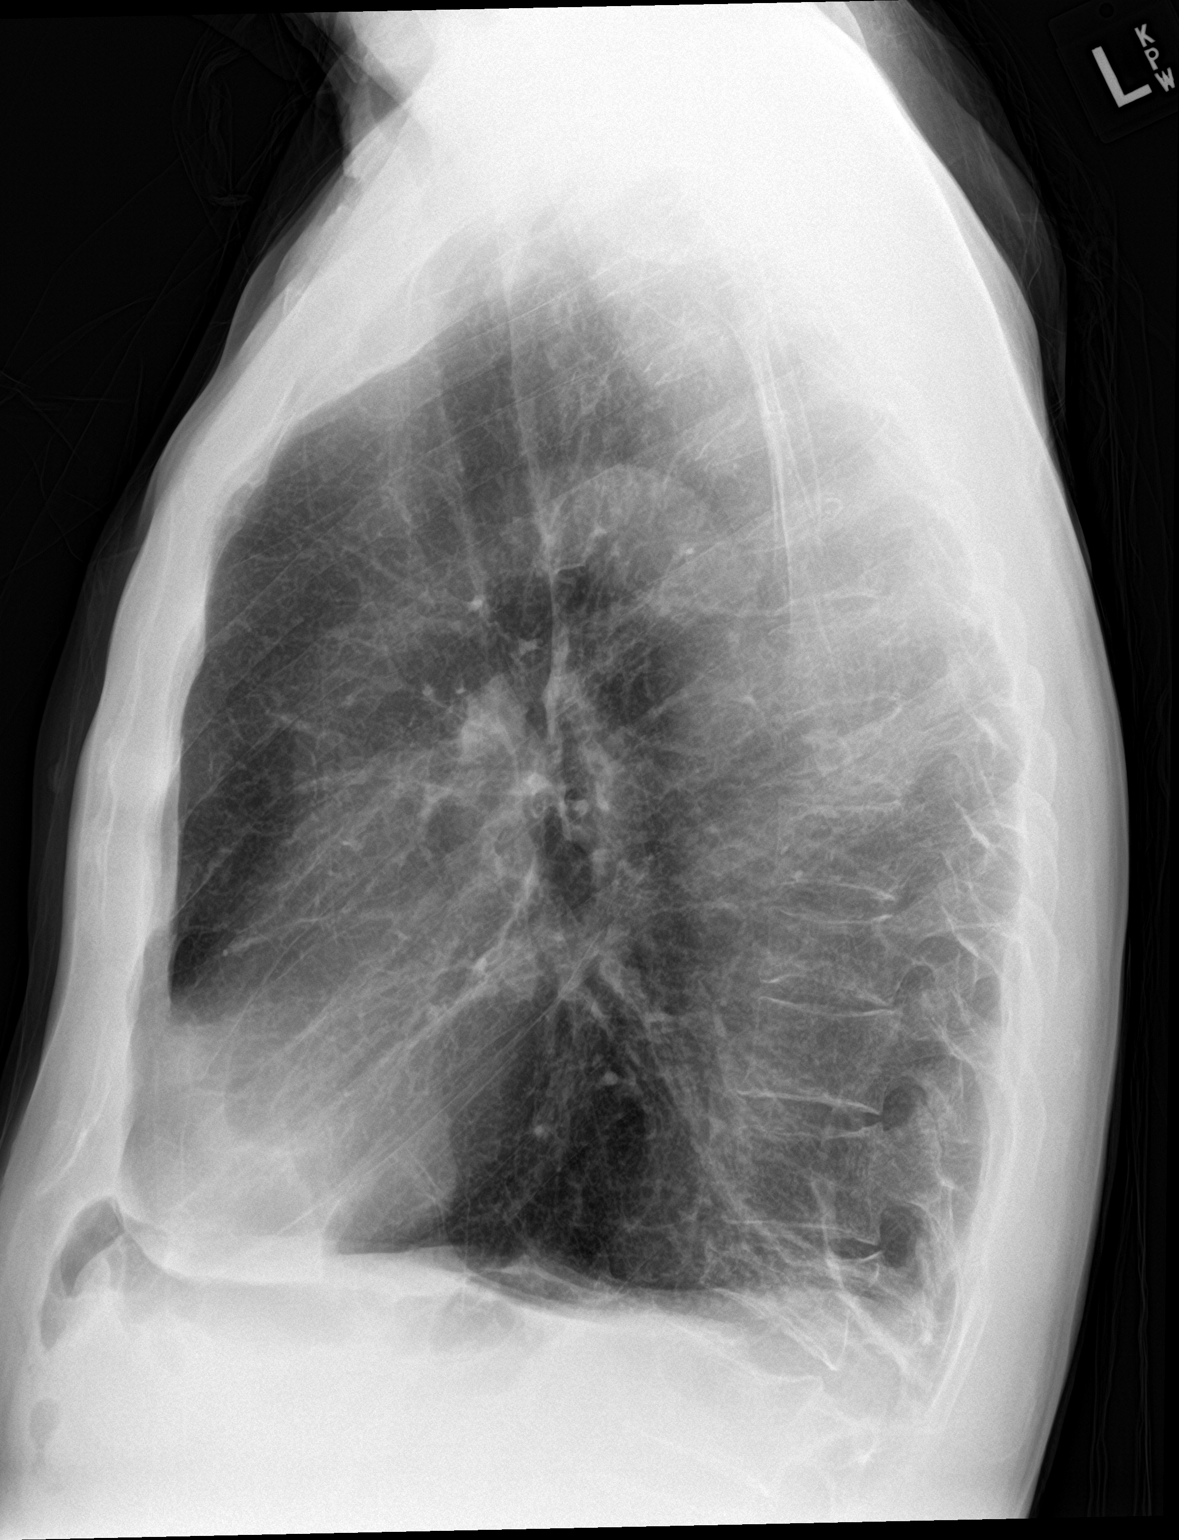

[2 of 2 positions shown; findings below may reference images not displayed]

FINDINGS: Normal sized heart. Clear lungs. The lungs are hyperexpanded with
mild diffuse peribronchial thickening and accentuation of the
interstitial markings. Thoracolumbar spine vertebral compression
deformities. These will be described separately in the thoracic
spine report.
IMPRESSION: 1. Thoracolumbar spine vertebral compression deformities, described
separately in the thoracic spine radiographs report.
2. Changes of COPD and chronic bronchitis.

## 2020-04-17 ENCOUNTER — Other Ambulatory Visit: Payer: Self-pay | Admitting: Family Medicine

## 2020-04-17 NOTE — Telephone Encounter (Signed)
Requested medications are due for refill today?  Yes  Requested medications are on active medication list?  Yes  Last Refill:  02/27/2020  # 18 g with 3 refills.  Notation made indicating patient needs to schedule an appointment for further refills.    Future visit scheduled?  No   Notes to Clinic:  Last visit was 5 months ago (11/09/2019)  - Virtual visit with instructions to follow-up in 3 months.  No follow-up scheduled.

## 2020-04-25 DIAGNOSIS — M25532 Pain in left wrist: Secondary | ICD-10-CM | POA: Insufficient documentation

## 2020-04-25 DIAGNOSIS — T148XXA Other injury of unspecified body region, initial encounter: Secondary | ICD-10-CM | POA: Insufficient documentation

## 2020-04-25 DIAGNOSIS — S52502A Unspecified fracture of the lower end of left radius, initial encounter for closed fracture: Secondary | ICD-10-CM | POA: Insufficient documentation

## 2020-05-10 NOTE — Progress Notes (Signed)
Open in error.   Suezanne Cheshire, CMA Clinical Pharmicist Assistant [phone]

## 2020-05-13 DIAGNOSIS — S42322A Displaced transverse fracture of shaft of humerus, left arm, initial encounter for closed fracture: Secondary | ICD-10-CM | POA: Insufficient documentation

## 2020-05-14 DIAGNOSIS — G5630 Lesion of radial nerve, unspecified upper limb: Secondary | ICD-10-CM | POA: Insufficient documentation

## 2020-05-16 ENCOUNTER — Telehealth: Payer: Self-pay

## 2020-05-16 NOTE — Telephone Encounter (Signed)
Copied from CRM [id number]. Topic: General - Inquiry >> May 16, 2020 11:07 AM Adrian Prince D wrote: Reason for CRM: Wylene Men called from [facility] called and stated that the patient is having surgery there tomorrow and The Anesthesiologist called and would like the last office notes done by Doctor Loraine Grip. They can be faxed to [phone]. If you have any questions the phone number is [phone]. Please advise

## 2020-05-16 NOTE — Telephone Encounter (Signed)
Note printed and faxed as requested.

## 2020-08-11 ENCOUNTER — Other Ambulatory Visit: Payer: Self-pay | Admitting: Family Medicine

## 2020-08-11 NOTE — Telephone Encounter (Signed)
Called pt and LM to call office to make virtual visit for this medication. Office number provided.

## 2020-08-13 ENCOUNTER — Other Ambulatory Visit: Payer: Self-pay | Admitting: Family Medicine

## 2020-08-13 ENCOUNTER — Telehealth: Payer: Self-pay | Admitting: Family Medicine

## 2020-08-13 MED ORDER — ALBUTEROL SULFATE HFA 108 (90 BASE) MCG/ACT IN AERS: INHALATION_SPRAY | RESPIRATORY_TRACT | 0 refills | Status: DC
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
## 2020-08-13 NOTE — Telephone Encounter (Signed)
Patient is back in [place], but does not have transportation. Will see him virtually, but please send warning letter that if he is not seen in person in in in the next 3 months, we will have to discharge

## 2020-08-13 NOTE — Telephone Encounter (Signed)
Pt calling again he states that he is really needing to have this medication due to a flare up. Pt apologizes for not being able to make an appt work at this time, but states that he will try to make one asap. Please advise.

## 2020-08-13 NOTE — Telephone Encounter (Signed)
Called pt, no answer, left vm. Per notes he needs to have an appt but has refused multiple times. Please advise

## 2020-08-13 NOTE — Telephone Encounter (Signed)
Patient has called back to notify PCP that he is unable to travel to office as well as unable to have a virtual visit at the current time.

## 2020-08-13 NOTE — Telephone Encounter (Signed)
If patient is unable to come in, we will have to discharge due to inability to come in- please send discharge letter, and we will be able to care for him for another 30 days.

## 2020-08-15 ENCOUNTER — Encounter: Payer: Self-pay | Admitting: Family Medicine

## 2020-08-15 ENCOUNTER — Ambulatory Visit (INDEPENDENT_AMBULATORY_CARE_PROVIDER_SITE_OTHER): Payer: Medicare Other | Admitting: Family Medicine

## 2020-08-15 DIAGNOSIS — R202 Paresthesia of skin: Secondary | ICD-10-CM | POA: Diagnosis not present

## 2020-08-15 DIAGNOSIS — J410 Simple chronic bronchitis: Secondary | ICD-10-CM | POA: Diagnosis not present

## 2020-08-15 DIAGNOSIS — Z86718 Personal history of other venous thrombosis and embolism: Secondary | ICD-10-CM

## 2020-08-15 DIAGNOSIS — S42322D Displaced transverse fracture of shaft of humerus, left arm, subsequent encounter for fracture with routine healing: Secondary | ICD-10-CM

## 2020-08-15 DIAGNOSIS — I749 Embolism and thrombosis of unspecified artery: Secondary | ICD-10-CM

## 2020-08-15 MED ORDER — ALBUTEROL SULFATE HFA 108 (90 BASE) MCG/ACT IN AERS: INHALATION_SPRAY | RESPIRATORY_TRACT | 0 refills | Status: DC

## 2020-08-15 MED ORDER — FLUTICASONE-SALMETEROL 113-14 MCG/ACT IN AEPB: 1.0000 | INHALATION_SPRAY | Freq: Two times a day (BID) | RESPIRATORY_TRACT | 2 refills | Status: DC

## 2020-08-15 MED ORDER — RIVAROXABAN 20 MG PO TABS: ORAL_TABLET | ORAL | 0 refills | Status: DC
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
## 2020-08-16 ENCOUNTER — Telehealth: Payer: Self-pay

## 2020-08-16 NOTE — Chronic Care Management (AMB) (Signed)
  Care Management   Note  08/16/2020 Name: Steve Chaney MRN: [id number] DOB: [birth year]  Steve Chaney is a 65 y.o. year old male who is a primary care patient of Dorcas Carrow, DO and is actively engaged with the care management team. I reached out to Leonarda Salon by phone today to assist with re-scheduling an initial visit with the Pharmacist  Follow up plan: Unsuccessful telephone outreach attempt made. A HIPAA compliant phone message was left for the patient providing contact information and requesting a return call.  The care management team will reach out to the patient again over the next 3 days.  If patient returns call to provider office, please advise to call Embedded Care Management Care Guide Penne Lash  at [phone]  Penne Lash, RMA Care Guide, Embedded Care Coordination [facility]  [place], [place] [address] Direct Dial: [phone] [email or web address]: [email or web address]

## 2020-08-20 ENCOUNTER — Ambulatory Visit: Payer: Self-pay | Admitting: Pharmacist

## 2020-08-20 NOTE — Progress Notes (Signed)
  Chronic Care Management   Note  08/20/2020 Name: Steve Chaney MRN: [id number] DOB: [birth year]  Reason for referral: Xarelto patient assistance  Referral source: Dr. Wynetta Emery Referral medication(s): xarelto Current insurance:NONE PMHx: H/O unprovoked PE/DVT with embolectomy 2014   HPI: Patient has been without medication for at least 2 months. He is uninsured.   Objective: No Known Allergies  Medications Reviewed Today    Reviewed by Valerie Roys, DO (Physician) on 08/15/20 at [place] List Status: <None>  Medication Order Taking? Sig Documenting Provider Last Dose Status Informant  albuterol (VENTOLIN HFA) 108 (90 Base) MCG/ACT inhaler [id number] Yes INHALE 2 PUFFS BY MOUTH INTO THE LUNGS EVERY 6 HOURS AS NEEDED FOR WHEEZING OR SHORTNESS OF BREATH Valerie Roys, DO Taking Active   Fluticasone-Salmeterol 113-14 MCG/ACT AEPB [id number] Yes Inhale 1 puff into the lungs in the morning and at bedtime. Volney American, [place] Taking Active   ibuprofen (ADVIL) 800 MG tablet [id number] Yes Take by mouth. [provider] Taking Active   ipratropium (ATROVENT) 0.02 % nebulizer solution [id number] Yes Inhale 1.25 mLs (0.25 mg total) into the lungs every 6 (six) hours as needed for wheezing or shortness of breath. Johnson, Megan P, DO Taking Active   rivaroxaban (XARELTO) 20 MG TABS tablet [id number] Yes TAKE 1 TABLET(20 MG) BY MOUTH DAILY WITH SUPPER Johnson, Megan P, DO Taking Active           Medication Assistance Findings:  Extra Help:   '[]'$  Already receiving Full Extra Help  $Rem'[]'Cfkx$  Already receiving Partial Extra Help  $Rem'[]'xWwH$  Eligible based on reported income and assets  $Remov'[x]'OWgWqL$  Not Eligible based on reported income and assets  Patient Assistance Programs: 1) Xarelto made by [place] requirement met: $RemoveBeforeDE'[]'vsOWhPhyqOMUhPU$  Yes $Re'[]'RyW$  No $R'[x]'CG$  Unknown o Out-of-pocket prescription expenditure met:    '[]'$  Yes $Re'[]'kbW$  No  $R'[]'mV$  Unknown  $Remove'[x]'RiudvQS$  Not applicable - Patient has an appointment in office  tomorrow. He will discuss Eliquis sample use until PAP application completed and processed.     Additional medication assistance options reviewed with patient as warranted:  Coupon and Agricultural consultant  Goals Addressed   None      Plan: Patient is to provide additional documents as necessary.   Follow up:  2 weeks  Junita Push. Kenton Kingfisher PharmD, [facility] [phone]

## 2020-08-21 ENCOUNTER — Other Ambulatory Visit: Payer: Self-pay

## 2020-08-21 ENCOUNTER — Ambulatory Visit (INDEPENDENT_AMBULATORY_CARE_PROVIDER_SITE_OTHER): Payer: Medicare Other | Admitting: Family Medicine

## 2020-08-21 ENCOUNTER — Encounter: Payer: Self-pay | Admitting: Family Medicine

## 2020-08-21 VITALS — BP 160/82 | HR 101 | Temp 98.2°F | Wt 145.0 lb

## 2020-08-21 DIAGNOSIS — Z5181 Encounter for therapeutic drug level monitoring: Secondary | ICD-10-CM | POA: Diagnosis not present

## 2020-08-21 DIAGNOSIS — M79641 Pain in right hand: Secondary | ICD-10-CM | POA: Diagnosis not present

## 2020-08-21 DIAGNOSIS — J410 Simple chronic bronchitis: Secondary | ICD-10-CM

## 2020-08-21 DIAGNOSIS — Z86711 Personal history of pulmonary embolism: Secondary | ICD-10-CM | POA: Diagnosis not present

## 2020-08-21 DIAGNOSIS — I749 Embolism and thrombosis of unspecified artery: Secondary | ICD-10-CM | POA: Diagnosis not present

## 2020-08-21 DIAGNOSIS — Z720 Tobacco use: Secondary | ICD-10-CM

## 2020-08-21 DIAGNOSIS — M79642 Pain in left hand: Secondary | ICD-10-CM

## 2020-08-21 DIAGNOSIS — Z7901 Long term (current) use of anticoagulants: Secondary | ICD-10-CM

## 2020-08-21 DIAGNOSIS — R030 Elevated blood-pressure reading, without diagnosis of hypertension: Secondary | ICD-10-CM

## 2020-08-21 DIAGNOSIS — M79643 Pain in unspecified hand: Secondary | ICD-10-CM | POA: Insufficient documentation

## 2020-08-21 HISTORY — DX: Elevated blood-pressure reading, without diagnosis of hypertension: R03.0

## 2020-08-21 MED ORDER — APIXABAN 5 MG PO TABS: 5.0000 mg | ORAL_TABLET | Freq: Two times a day (BID) | ORAL | 0 refills | Status: DC

## 2020-08-21 NOTE — Assessment & Plan Note (Signed)
Bilateral with tingling, few weeks with unknown trauma/injury. Unclear etiology. MSK/Neuro exam only notable for slight muscular atrophy to L lumbricals and hypothenar eminence with slight decreased L grip strength from recent humeral and radial surgery, improved since attending PT. 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 Will obtain b/l doppler US and restart anticoagulation, samples of eliquis provided today. No sx to concern for pulmonary involvement. Tylenol for pain. F/u in 2 weeks.

## 2020-08-21 NOTE — Patient Instructions (Signed)
It was great to see you!  Our plans for today:  - Take the eliquis 5mg  twice daily until we can get your xarelto affordably. - We are getting an ultrasound of your arms to make sure you don't have a clot. - we are referring you to hematology given your history of clots. - Take your fluticasone inhaler every day. - come back in 2 weeks.   We are checking some labs today, we will release these results to your MyChart.  Take care and seek immediate care sooner if you develop any concerns.   Dr. 

## 2020-08-21 NOTE — Chronic Care Management (AMB) (Signed)
  Chronic Care Management   Note  08/21/2020 Name: Daris Harkins MRN: [id number] DOB: 1955/10/22  Eshan Trupiano is a 65 y.o. year old male who is a primary care patient of Dorcas Carrow, DO. Girolamo Lortie is currently enrolled in care management services. An additional referral for Pharm D was placed.   Follow up plan: Telephone appointment with care management team member scheduled for:08/20/2020  Penne Lash, RMA Care Guide, Embedded Care Coordination [facility]  [place], [place] [address] Direct Dial: [phone] [email or web address] Website: [email or web address]

## 2020-08-21 NOTE — Assessment & Plan Note (Signed)
Initially 170s SBP, improved to 160s in office. No prior h/o HTN or elevated BP. May be 2/2 pain, no symptoms to concern for hypertensive emergency. Will monitor and repeat in 2 weeks at follow up.

## 2020-08-21 NOTE — Assessment & Plan Note (Addendum)
With 1 exacerbation few months ago, has been doing better since taking controller inhaler more regularly, counseling provided regarding this. Continue current regimen. Recommend tobacco cessation.

## 2020-08-21 NOTE — Assessment & Plan Note (Signed)
Recommended cessation.  

## 2020-08-21 NOTE — Assessment & Plan Note (Signed)
Unprovoked in 2014. Has been out of anticoagulation for few months with recent surgery. PAP paperwork reviewed with patient today. Eliquis samples given until able to obtain xarelto. Referred to hematology as has been unable to obtain hypercoagulable w/u in the past. Labs obtained today.
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
## 2020-08-22 ENCOUNTER — Ambulatory Visit: Payer: Self-pay | Admitting: Family Medicine

## 2020-08-22 LAB — COMPREHENSIVE METABOLIC PANEL
ALT: 21 IU/L (ref 0–44)
AST: 21 IU/L (ref 0–40)
Albumin/Globulin Ratio: 1.6 (ref 1.2–2.2)
Albumin: 4.2 g/dL (ref 3.8–4.8)
Alkaline Phosphatase: 112 IU/L (ref 44–121)
BUN/Creatinine Ratio: 7 — ABNORMAL LOW (ref 10–24)
BUN: 5 mg/dL — ABNORMAL LOW (ref 8–27)
Bilirubin Total: 0.5 mg/dL (ref 0.0–1.2)
CO2: 21 mmol/L (ref 20–29)
Calcium: 9.4 mg/dL (ref 8.6–10.2)
Chloride: 100 mmol/L (ref 96–106)
Creatinine, Ser: 0.76 mg/dL (ref 0.76–1.27)
GFR calc Af Amer: 111 mL/min/{1.73_m2} (ref >59)
GFR calc non Af Amer: 96 mL/min/{1.73_m2} (ref >59)
Globulin, Total: 2.7 g/dL (ref 1.5–4.5)
Glucose: 90 mg/dL (ref 65–99)
Potassium: 4 mmol/L (ref 3.5–5.2)
Sodium: 138 mmol/L (ref 134–144)
Total Protein: 6.9 g/dL (ref 6.0–8.5)

## 2020-08-22 LAB — APTT: aPTT: 31 s (ref 24–33)

## 2020-08-22 LAB — CBC
Hematocrit: 47.8 % (ref 37.5–51.0)
Hemoglobin: 15.3 g/dL (ref 13.0–17.7)
MCH: 28.5 pg (ref 26.6–33.0)
MCHC: 32 g/dL (ref 31.5–35.7)
MCV: 89 fL (ref 79–97)
Platelets: 482 10*3/uL — ABNORMAL HIGH (ref 150–450)
RBC: 5.37 x10E6/uL (ref 4.14–5.80)
RDW: 15.8 % — ABNORMAL HIGH (ref 11.6–15.4)
WBC: 8.3 10*3/uL (ref 3.4–10.8)

## 2020-08-22 LAB — PROTIME-INR
INR: 1 (ref 0.9–1.2)
Prothrombin Time: 10.4 s (ref 9.1–12.0)

## 2020-08-25 NOTE — Assessment & Plan Note (Signed)
Under good control on current regimen. Continue current regimen. Continue to monitor. Call with any concerns. Refills given. Will check in person shortly.

## 2020-08-25 NOTE — Assessment & Plan Note (Signed)
Has been off his xarelto and has been having paresthesias in his hand. Will get him back on his xarelto and get him in for in person evaluation ASAP for pulse evaluation.

## 2020-09-02 ENCOUNTER — Other Ambulatory Visit: Payer: Self-pay | Admitting: Family Medicine

## 2020-09-02 MED ORDER — ALBUTEROL SULFATE HFA 108 (90 BASE) MCG/ACT IN AERS: INHALATION_SPRAY | RESPIRATORY_TRACT | 0 refills | Status: DC

## 2020-09-02 NOTE — Telephone Encounter (Signed)
   Future visit scheduled: yes  Notes to clinic:   One inhaler should last at least one month. If the patient is requesting refills earlier, contact the patient to check for uncontrolled symptoms.   Valid encounter within last 12 months     Requested Prescriptions  Pending Prescriptions Disp Refills   albuterol (VENTOLIN HFA) 108 (90 Base) MCG/ACT inhaler 8 g 0    Sig: INHALE 2 PUFFS BY MOUTH INTO THE LUNGS EVERY 6 HOURS AS NEEDED FOR WHEEZING OR SHORTNESS OF BREATH      Pulmonology:  Beta Agonists Failed - 09/02/2020  2:57 PM      Failed - One inhaler should last at least one month. If the patient is requesting refills earlier, contact the patient to check for uncontrolled symptoms.      Passed - Valid encounter within last 12 months    Recent Outpatient Visits           1 week ago Arterial thrombosis [facility])   [facility] [place] Dense M, DO   2 weeks ago Closed displaced transverse fracture of shaft of left humerus with routine healing, subsequent encounter   [facility] [place], Megan P, DO   9 months ago Simple chronic bronchitis [facility])   [facility] [place], Megan P, DO   1 year ago Procedure and treatment not carried out due to patient leaving prior to being seen by health care provider   [facility] Olevia Perches P, DO   1 year ago Compression fracture of T8 vertebra, initial encounter [facility])   [facility] Dorcas Carrow, DO       Future Appointments             In 2 days Dorcas Carrow, DO [facility]

## 2020-09-02 NOTE — Telephone Encounter (Signed)
Medication: albuterol (VENTOLIN HFA) 108 (90 Base) MCG/ACT inhaler [[id number]]   Has the patient contacted their pharmacy? YES  (Agent: If no, request that the patient contact the pharmacy for the refill.) (Agent: If yes, when and what did the pharmacy advise?)  Preferred Pharmacy (with phone number or street name): [id number] - [place], [place] - [address]  [address], [place] [address]  Phone:  [phone] Fax:  [phone]   Agent: Please be advised that RX refills may take up to 3 business days. We ask that you follow-up with your pharmacy.

## 2020-09-03 ENCOUNTER — Inpatient Hospital Stay: Payer: Medicare Other | Admitting: Oncology

## 2020-09-03 ENCOUNTER — Inpatient Hospital Stay: Payer: Medicare Other

## 2020-09-04 ENCOUNTER — Ambulatory Visit: Payer: Medicare Other | Admitting: Family Medicine

## 2020-09-10 ENCOUNTER — Inpatient Hospital Stay: Payer: Medicare Other | Admitting: Oncology

## 2020-09-10 ENCOUNTER — Inpatient Hospital Stay: Payer: Medicare Other

## 2020-09-13 ENCOUNTER — Encounter: Payer: Self-pay | Admitting: Family Medicine

## 2020-09-13 ENCOUNTER — Ambulatory Visit (INDEPENDENT_AMBULATORY_CARE_PROVIDER_SITE_OTHER): Payer: Medicare Other | Admitting: Family Medicine

## 2020-09-13 ENCOUNTER — Other Ambulatory Visit: Payer: Self-pay

## 2020-09-13 ENCOUNTER — Inpatient Hospital Stay: Payer: Medicare Other | Attending: Oncology | Admitting: Oncology

## 2020-09-13 ENCOUNTER — Encounter: Payer: Self-pay | Admitting: Oncology

## 2020-09-13 ENCOUNTER — Inpatient Hospital Stay: Payer: Medicare Other

## 2020-09-13 VITALS — BP 170/89 | HR 82 | Temp 98.5°F | Ht 74.0 in | Wt 128.4 lb

## 2020-09-13 VITALS — BP 165/98 | HR 80 | Temp 97.8°F | Resp 18 | Wt 127.7 lb

## 2020-09-13 DIAGNOSIS — R030 Elevated blood-pressure reading, without diagnosis of hypertension: Secondary | ICD-10-CM

## 2020-09-13 DIAGNOSIS — Z131 Encounter for screening for diabetes mellitus: Secondary | ICD-10-CM

## 2020-09-13 DIAGNOSIS — D75839 Thrombocytosis, unspecified: Secondary | ICD-10-CM | POA: Diagnosis not present

## 2020-09-13 DIAGNOSIS — F3342 Major depressive disorder, recurrent, in full remission: Secondary | ICD-10-CM

## 2020-09-13 DIAGNOSIS — R3911 Hesitancy of micturition: Secondary | ICD-10-CM

## 2020-09-13 DIAGNOSIS — Z86711 Personal history of pulmonary embolism: Secondary | ICD-10-CM

## 2020-09-13 DIAGNOSIS — I7410 Embolism and thrombosis of unspecified parts of aorta: Secondary | ICD-10-CM | POA: Diagnosis not present

## 2020-09-13 DIAGNOSIS — R200 Anesthesia of skin: Secondary | ICD-10-CM | POA: Diagnosis not present

## 2020-09-13 DIAGNOSIS — I70223 Atherosclerosis of native arteries of extremities with rest pain, bilateral legs: Secondary | ICD-10-CM | POA: Insufficient documentation

## 2020-09-13 DIAGNOSIS — I749 Embolism and thrombosis of unspecified artery: Secondary | ICD-10-CM | POA: Diagnosis not present

## 2020-09-13 DIAGNOSIS — J410 Simple chronic bronchitis: Secondary | ICD-10-CM

## 2020-09-13 DIAGNOSIS — Z136 Encounter for screening for cardiovascular disorders: Secondary | ICD-10-CM

## 2020-09-13 DIAGNOSIS — Z720 Tobacco use: Secondary | ICD-10-CM | POA: Diagnosis not present

## 2020-09-13 DIAGNOSIS — F17210 Nicotine dependence, cigarettes, uncomplicated: Secondary | ICD-10-CM | POA: Insufficient documentation

## 2020-09-13 DIAGNOSIS — R202 Paresthesia of skin: Secondary | ICD-10-CM

## 2020-09-13 DIAGNOSIS — Z86718 Personal history of other venous thrombosis and embolism: Secondary | ICD-10-CM

## 2020-09-13 DIAGNOSIS — I10 Essential (primary) hypertension: Secondary | ICD-10-CM

## 2020-09-13 DIAGNOSIS — I998 Other disorder of circulatory system: Secondary | ICD-10-CM

## 2020-09-13 LAB — URINALYSIS, ROUTINE W REFLEX MICROSCOPIC
Bilirubin, UA: NEGATIVE
Glucose, UA: NEGATIVE
Ketones, UA: NEGATIVE
Leukocytes,UA: NEGATIVE
Nitrite, UA: NEGATIVE
Protein,UA: NEGATIVE
RBC, UA: NEGATIVE
Specific Gravity, UA: 1.01 (ref 1.005–1.030)
Urobilinogen, Ur: 0.2 mg/dL (ref 0.2–1.0)
pH, UA: 6.5 (ref 5.0–7.5)

## 2020-09-13 LAB — BAYER DCA HB A1C WAIVED: HB A1C (BAYER DCA - WAIVED): 5.2 % (ref <7.0)

## 2020-09-13 LAB — MICROALBUMIN, URINE WAIVED
Creatinine, Urine Waived: 50 mg/dL (ref 10–300)
Microalb, Ur Waived: 10 mg/L (ref 0–19)
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
## 2020-09-13 NOTE — Progress Notes (Signed)
BP (!) 170/89   Pulse 82   Temp 98.5 F (36.9 C)   Ht '6\' 2"'  (1.88 m)   Wt 128 lb 6.4 oz (58.2 kg)   SpO2 99%   BMI 16.49 kg/m    Subjective:    Patient ID: Steve Chaney, male    DOB: Jul 31, 1955, 65 y.o.   MRN: [id number]  HPI: Steve Chaney is a 65 y.o. male  Chief Complaint  Patient presents with  . Hand Pain    Follow up   . Hypertension   HYPERTENSION Hypertension status: uncontrolled  Satisfied with current treatment? no Duration of hypertension: unknown BP monitoring frequency:  not checking BP medication side effects:  Not on anything Previous BP meds: none Aspirin: no Recurrent headaches: no Visual changes: no Palpitations: no Dyspnea: no Chest pain: no Lower extremity edema: no Dizzy/lightheaded: no   He was not able to get his [place] due to transportation issues. He continues with numbness and tingling and pain in his hand. He is going to see hematology today.   Relevant past medical, surgical, family and social history reviewed and updated as indicated. Interim medical history since our last visit reviewed. Allergies and medications reviewed and updated.  Review of Systems  Constitutional: Negative.   Respiratory: Negative.   Cardiovascular: Negative.   Gastrointestinal: Negative.   Musculoskeletal: Negative.   Neurological: Positive for numbness. Negative for dizziness, tremors, seizures, syncope, facial asymmetry, speech difficulty, weakness, light-headedness and headaches.  Psychiatric/Behavioral: Negative.     Per HPI unless specifically indicated above     Objective:    BP (!) 170/89   Pulse 82   Temp 98.5 F (36.9 C)   Ht '6\' 2"'  (1.88 m)   Wt 128 lb 6.4 oz (58.2 kg)   SpO2 99%   BMI 16.49 kg/m   Wt Readings from Last 3 Encounters:  09/13/20 127 lb 11.2 oz (57.9 kg)  09/13/20 128 lb 6.4 oz (58.2 kg)  08/21/20 145 lb (65.8 kg)    Physical Exam Vitals and nursing note reviewed.  Constitutional:      General: He is not in acute  distress.    Appearance: Normal appearance. He is not ill-appearing, toxic-appearing or diaphoretic.  HENT:     Head: Normocephalic and atraumatic.     Right Ear: External ear normal.     Left Ear: External ear normal.     Nose: Nose normal.     Mouth/Throat:     Mouth: Mucous membranes are moist.     Pharynx: Oropharynx is clear.  Eyes:     General: No scleral icterus.       Right eye: No discharge.        Left eye: No discharge.     Extraocular Movements: Extraocular movements intact.     Conjunctiva/sclera: Conjunctivae normal.     Pupils: Pupils are equal, round, and reactive to light.  Cardiovascular:     Rate and Rhythm: Normal rate and regular rhythm.     Pulses: Normal pulses.     Heart sounds: Normal heart sounds. No murmur heard. No friction rub. No gallop.   Pulmonary:     Effort: Pulmonary effort is normal. No respiratory distress.     Breath sounds: Normal breath sounds. No stridor. No wheezing, rhonchi or rales.  Chest:     Chest wall: No tenderness.  Musculoskeletal:        General: Normal range of motion.     Cervical back: Normal range of motion and  neck supple.  Skin:    General: Skin is warm and dry.     Capillary Refill: Capillary refill takes less than 2 seconds.     Coloration: Skin is not jaundiced or pale.     Findings: No bruising, erythema, lesion or rash.  Neurological:     General: No focal deficit present.     Mental Status: He is alert and oriented to person, place, and time. Mental status is at baseline.  Psychiatric:        Mood and Affect: Mood normal.        Behavior: Behavior normal.        Thought Content: Thought content normal.        Judgment: Judgment normal.     Results for orders placed or performed in visit on 09/13/20  Microalbumin, Urine Waived  Result Value Ref Range   Microalb, Ur Waived 10 0 - 19 mg/L   Creatinine, Urine Waived 50 10 - 300 mg/dL   Microalb/Creat Ratio 30-300 (H) <30 mg/g  Comprehensive metabolic panel   Result Value Ref Range   Glucose 82 65 - 99 mg/dL   BUN 6 (L) 8 - 27 mg/dL   Creatinine, Ser 0.79 0.76 - 1.27 mg/dL   eGFR 99 >59 mL/min/1.73   BUN/Creatinine Ratio 8 (L) 10 - 24   Sodium 139 134 - 144 mmol/L   Potassium 3.9 3.5 - 5.2 mmol/L   Chloride 103 96 - 106 mmol/L   CO2 19 (L) 20 - 29 mmol/L   Calcium 8.9 8.6 - 10.2 mg/dL   Total Protein 6.5 6.0 - 8.5 g/dL   Albumin 3.9 3.8 - 4.8 g/dL   Globulin, Total 2.6 1.5 - 4.5 g/dL   Albumin/Globulin Ratio 1.5 1.2 - 2.2   Bilirubin Total 0.2 0.0 - 1.2 mg/dL   Alkaline Phosphatase 106 44 - 121 IU/L   AST 17 0 - 40 IU/L   ALT 15 0 - 44 IU/L  CBC with Differential/Platelet  Result Value Ref Range   WBC 6.7 3.4 - 10.8 x10E3/uL   RBC 4.98 4.14 - 5.80 x10E6/uL   Hemoglobin 14.1 13.0 - 17.7 g/dL   Hematocrit 44.2 37.5 - 51.0 %   MCV 89 79 - 97 fL   MCH 28.3 26.6 - 33.0 pg   MCHC 31.9 31.5 - 35.7 g/dL   RDW 15.3 11.6 - 15.4 %   Platelets 442 150 - 450 x10E3/uL   Neutrophils 72 Not Estab. %   Lymphs 13 Not Estab. %   Monocytes 12 Not Estab. %   Eos 2 Not Estab. %   Basos 1 Not Estab. %   Neutrophils Absolute 4.8 1.4 - 7.0 x10E3/uL   Lymphocytes Absolute 0.9 0.7 - 3.1 x10E3/uL   Monocytes Absolute 0.8 0.1 - 0.9 x10E3/uL   EOS (ABSOLUTE) 0.1 0.0 - 0.4 x10E3/uL   Basophils Absolute 0.1 0.0 - 0.2 x10E3/uL   Immature Granulocytes 0 Not Estab. %   Immature Grans (Abs) 0.0 0.0 - 0.1 x10E3/uL  Lipid Panel w/o Chol/HDL Ratio  Result Value Ref Range   Cholesterol, Total 178 100 - 199 mg/dL   Triglycerides 87 0 - 149 mg/dL   HDL 62 >39 mg/dL   VLDL Cholesterol Cal 16 5 - 40 mg/dL   LDL Chol Calc (NIH) 100 (H) 0 - 99 mg/dL  PSA  Result Value Ref Range   Prostate Specific Ag, Serum 0.6 0.0 - 4.0 ng/mL  TSH  Result Value Ref Range   TSH  3.560 0.450 - 4.500 uIU/mL  Urinalysis, Routine w reflex microscopic  Result Value Ref Range   Specific Gravity, UA 1.010 1.005 - 1.030   pH, UA 6.5 5.0 - 7.5   Color, UA Yellow Yellow   Appearance  Ur Clear Clear   Leukocytes,UA Negative Negative   Protein,UA Negative Negative/Trace   Glucose, UA Negative Negative   Ketones, UA Negative Negative   RBC, UA Negative Negative   Bilirubin, UA Negative Negative   Urobilinogen, Ur 0.2 0.2 - 1.0 mg/dL   Nitrite, UA Negative Negative  Bayer DCA Hb A1c Waived  Result Value Ref Range   HB A1C (BAYER DCA - WAIVED) 5.2 <7.0 %      Assessment & Plan:   Problem List Items Addressed This Visit      Cardiovascular and Mediastinum   Primary hypertension    BP still high. Will start lisinopril. Labs drawn today.      Relevant Medications   lisinopril (ZESTRIL) 10 MG tablet     Respiratory   COPD (chronic obstructive pulmonary disease) ([place]) - Primary    Under good control on current regimen. Continue current regimen. Continue to monitor. Call with any concerns. Refills given. Labs drawn today.        Relevant Orders   Comprehensive metabolic panel (Completed)   CBC with Differential/Platelet (Completed)     Other   History of arterial thrombosis    Will get him restarted on his xarelto, but can't afford- so started on eliquis with samples today. Seeing hematology today. Call with any concerns. Continue to monitor.       Relevant Orders   [place] UPPER EXTREMITY DUPLEX BILATERAL (NON-WBI)   Depression    Doing OK right now. Continue to monitor. Call with any concerns.       Relevant Orders   Comprehensive metabolic panel (Completed)   CBC with Differential/Platelet (Completed)   TSH (Completed)   Tobacco use    Not ready to quit- will check labs today. Continue to monitor.       Relevant Orders   Comprehensive metabolic panel (Completed)   CBC with Differential/Platelet (Completed)   Urinalysis, Routine w reflex microscopic (Completed)   History of pulmonary embolus (PE)    Will get him restarted on his xarelto, but can't afford- so started on eliquis with samples today. Seeing hematology today. Call with any concerns.  Continue to monitor.       Relevant Orders   [place] UPPER EXTREMITY DUPLEX BILATERAL (NON-WBI)   RESOLVED: Elevated BP without diagnosis of hypertension    BP still high. Will start lisinopril. Labs drawn today.      Relevant Orders   Microalbumin, Urine Waived (Completed)   Comprehensive metabolic panel (Completed)   CBC with Differential/Platelet (Completed)    Other Visit Diagnoses    Screening for diabetes mellitus       Labs drawn today. Await results. Treat as needed.    Relevant Orders   Bayer DCA Hb A1c Waived (Completed)   Hesitancy       Labs drawn today. Await results. Treat as needed.    Relevant Orders   PSA (Completed)   Screening for cardiovascular condition       Labs drawn today. Await results. Treat as needed.    Relevant Orders   Lipid Panel w/o Chol/HDL Ratio (Completed)   Paresthesias       Did not get his [place]. Will see if we can arrange it for today. Await results.  Relevant Orders   [place] UPPER EXTREMITY DUPLEX BILATERAL (NON-WBI)       Follow up plan: Return in about 4 weeks (around 10/11/2020) for welcome to medicare.

## 2020-09-14 ENCOUNTER — Encounter: Payer: Self-pay | Admitting: Family Medicine

## 2020-09-14 DIAGNOSIS — I10 Essential (primary) hypertension: Secondary | ICD-10-CM | POA: Insufficient documentation

## 2020-09-14 LAB — COMPREHENSIVE METABOLIC PANEL
ALT: 15 IU/L (ref 0–44)
AST: 17 IU/L (ref 0–40)
Albumin/Globulin Ratio: 1.5 (ref 1.2–2.2)
Albumin: 3.9 g/dL (ref 3.8–4.8)
Alkaline Phosphatase: 106 IU/L (ref 44–121)
BUN/Creatinine Ratio: 8 — ABNORMAL LOW (ref 10–24)
BUN: 6 mg/dL — ABNORMAL LOW (ref 8–27)
Bilirubin Total: 0.2 mg/dL (ref 0.0–1.2)
CO2: 19 mmol/L — ABNORMAL LOW (ref 20–29)
Calcium: 8.9 mg/dL (ref 8.6–10.2)
Chloride: 103 mmol/L (ref 96–106)
Creatinine, Ser: 0.79 mg/dL (ref 0.76–1.27)
Globulin, Total: 2.6 g/dL (ref 1.5–4.5)
Glucose: 82 mg/dL (ref 65–99)
Potassium: 3.9 mmol/L (ref 3.5–5.2)
Sodium: 139 mmol/L (ref 134–144)
Total Protein: 6.5 g/dL (ref 6.0–8.5)
eGFR: 99 mL/min/{1.73_m2} (ref >59)

## 2020-09-14 LAB — LIPID PANEL W/O CHOL/HDL RATIO
Cholesterol, Total: 178 mg/dL (ref 100–199)
HDL: 62 mg/dL (ref >39)
LDL Chol Calc (NIH): 100 mg/dL — ABNORMAL HIGH (ref 0–99)
Triglycerides: 87 mg/dL (ref 0–149)
VLDL Cholesterol Cal: 16 mg/dL (ref 5–40)

## 2020-09-14 LAB — CBC WITH DIFFERENTIAL/PLATELET
Basophils Absolute: 0.1 10*3/uL (ref 0.0–0.2)
Basos: 1 %
EOS (ABSOLUTE): 0.1 10*3/uL (ref 0.0–0.4)
Eos: 2 %
Hematocrit: 44.2 % (ref 37.5–51.0)
Hemoglobin: 14.1 g/dL (ref 13.0–17.7)
Immature Grans (Abs): 0 10*3/uL (ref 0.0–0.1)
Immature Granulocytes: 0 %
Lymphocytes Absolute: 0.9 10*3/uL (ref 0.7–3.1)
Lymphs: 13 %
MCH: 28.3 pg (ref 26.6–33.0)
MCHC: 31.9 g/dL (ref 31.5–35.7)
MCV: 89 fL (ref 79–97)
Monocytes Absolute: 0.8 10*3/uL (ref 0.1–0.9)
Monocytes: 12 %
Neutrophils Absolute: 4.8 10*3/uL (ref 1.4–7.0)
Neutrophils: 72 %
Platelets: 442 10*3/uL (ref 150–450)
RBC: 4.98 x10E6/uL (ref 4.14–5.80)
RDW: 15.3 % (ref 11.6–15.4)
WBC: 6.7 10*3/uL (ref 3.4–10.8)

## 2020-09-14 LAB — TSH: TSH: 3.56 u[IU]/mL (ref 0.450–4.500)

## 2020-09-14 LAB — PSA: Prostate Specific Ag, Serum: 0.6 ng/mL (ref 0.0–4.0)

## 2020-09-14 MED ORDER — LISINOPRIL 10 MG PO TABS: 10.0000 mg | ORAL_TABLET | Freq: Every day | ORAL | 3 refills | Status: DC

## 2020-09-14 NOTE — Assessment & Plan Note (Signed)
Doing OK right now. Continue to monitor. Call with any concerns.  

## 2020-09-14 NOTE — Assessment & Plan Note (Signed)
Not ready to quit- will check labs today. Continue to monitor.

## 2020-09-14 NOTE — Assessment & Plan Note (Signed)
BP still high. Will start lisinopril. Labs drawn today. 

## 2020-09-14 NOTE — Assessment & Plan Note (Signed)
BP still high. Will start lisinopril. Labs drawn today.

## 2020-09-14 NOTE — Assessment & Plan Note (Signed)
Will get him restarted on his xarelto, but can't afford- so started on eliquis with samples today. Seeing hematology today. Call with any concerns. Continue to monitor.

## 2020-09-14 NOTE — Assessment & Plan Note (Signed)
Under good control on current regimen. Continue current regimen. Continue to monitor. Call with any concerns. Refills given. Labs drawn today.   

## 2020-09-14 NOTE — Assessment & Plan Note (Signed)
Will get him restarted on his xarelto, but can't afford- so started on eliquis with samples today. Seeing hematology today. Call with any concerns. Continue to monitor.  

## 2020-09-16 ENCOUNTER — Telehealth: Payer: Self-pay

## 2020-09-16 DIAGNOSIS — I7410 Embolism and thrombosis of unspecified parts of aorta: Secondary | ICD-10-CM

## 2020-09-16 DIAGNOSIS — I998 Other disorder of circulatory system: Secondary | ICD-10-CM

## 2020-09-16 NOTE — Telephone Encounter (Addendum)
Patient notified of MD recommendations.  Referral for vascular surgeon entered.

## 2020-09-16 NOTE — Progress Notes (Signed)
I do not see these forms in the file. Anyone know about the status?

## 2020-09-16 NOTE — Telephone Encounter (Signed)
Please contact patient to schedule lab appt.

## 2020-09-16 NOTE — Telephone Encounter (Signed)
-----   Message from Rickard Patience, MD sent at 09/13/2020 11:17 PM EST ----- Please let him know that I reviewed his past records and recommend him to do blood work. Orders are in. I recommend him to establish care with vascular surgery. Please refer.  If blood work is negative, then he does not need to follow up with me. Thanks.

## 2020-09-19 ENCOUNTER — Telehealth: Payer: Self-pay

## 2020-09-19 NOTE — Telephone Encounter (Signed)
[employer] paperwork completed and faxed in per patient request.

## 2020-09-20 ENCOUNTER — Telehealth: Payer: Self-pay

## 2020-09-20 NOTE — Telephone Encounter (Signed)
Patient called to cancelled venous due to work .    Patient wants asap next week due to current symptoms.  Unable to schedule at [facility] .  Patient previously cancelled prior appt as well.  Adding to cancellation list.    Patient able to be worked in early next week at [employer] or opic per central scheduling line. [phone].   Per scheduling change order to ultra sound - venous and specify which arm or bilateral testing needed.    Patient aware to call central scheduling on Monday to make his appt.  Number provided.

## 2020-09-21 NOTE — Telephone Encounter (Signed)
Noted, will leave for Dr. Lanier Ensign upon return.

## 2020-09-21 NOTE — Telephone Encounter (Signed)
Hi Steve Chaney, So sorry this was cancelled again. Per his history, current symptoms and recent evaluation by Hematology who also wonders if this may be arterial in origin, would prefer if he had arterial imaging with doppler of bilateral arms instead of venous imaging. Let me know if I need to change the original order to reflect the change in imaging center. Also copying PCP as FYI.  Ellwood Dense, DO 09/21/2020 10:48 AM

## 2020-09-23 ENCOUNTER — Telehealth: Payer: Self-pay

## 2020-09-23 NOTE — Telephone Encounter (Signed)
Jill Side -  I am not sure about the order.  Please call the central scheduling line to discuss. [phone].

## 2020-09-23 NOTE — Telephone Encounter (Signed)
Placed correct order as noted below and reviewed chart.

## 2020-09-23 NOTE — Addendum Note (Signed)
Addended by: Aura Dials T on: 09/23/2020 07:14 PM   Modules accepted: Orders

## 2020-09-23 NOTE — Telephone Encounter (Signed)
PCP out of the office, routing to provider covering.

## 2020-09-23 NOTE — Telephone Encounter (Signed)
Copied from CRM [id number]. Topic: General - Other >> Sep 23, 2020  3:45 PM Wyonia Hough E wrote: Reason for CRM: on 3.4.22 Ultrasound order placed for upper extemetly duplex bilateral/ but they cant schedule those there/ they do the upper extremety arteral duplex / the order number for that is [id number]/ please advise asap/ Martie Lee will be available until 4pm

## 2020-09-24 NOTE — Telephone Encounter (Signed)
They ended up contacting us and I placed correct order == this was their message "Ultrasound order placed for upper extemetly duplex bilateral/ but they cant schedule those there/ they do the upper extremety arteral duplex / the order number for that is [id number]/ please advise asap" -- I reviewed note and corrected for order they recommended.

## 2020-09-24 NOTE — Telephone Encounter (Signed)
Sabrina notified.

## 2020-09-26 ENCOUNTER — Ambulatory Visit (INDEPENDENT_AMBULATORY_CARE_PROVIDER_SITE_OTHER): Payer: Medicare Other

## 2020-09-26 ENCOUNTER — Other Ambulatory Visit: Payer: Self-pay

## 2020-09-26 ENCOUNTER — Other Ambulatory Visit: Payer: Self-pay | Admitting: Nurse Practitioner

## 2020-09-26 DIAGNOSIS — R202 Paresthesia of skin: Secondary | ICD-10-CM | POA: Diagnosis not present

## 2020-09-26 DIAGNOSIS — Z86718 Personal history of other venous thrombosis and embolism: Secondary | ICD-10-CM

## 2020-09-26 DIAGNOSIS — R200 Anesthesia of skin: Secondary | ICD-10-CM

## 2020-09-26 DIAGNOSIS — Z9862 Peripheral vascular angioplasty status: Secondary | ICD-10-CM | POA: Diagnosis not present

## 2020-09-26 NOTE — Progress Notes (Signed)
Please let Mr. Hemmelgarn know his imaging returned showing no obstruction and I will alert Dr. Laural Benes to results upon return.  Have a great day!!

## 2020-09-30 ENCOUNTER — Other Ambulatory Visit: Payer: Medicare Other

## 2020-09-30 ENCOUNTER — Other Ambulatory Visit: Payer: Self-pay | Admitting: Family Medicine

## 2020-10-11 ENCOUNTER — Other Ambulatory Visit: Payer: Self-pay

## 2020-10-11 ENCOUNTER — Encounter: Payer: Self-pay | Admitting: Family Medicine

## 2020-10-11 ENCOUNTER — Other Ambulatory Visit: Payer: Self-pay | Admitting: Family Medicine

## 2020-10-11 ENCOUNTER — Ambulatory Visit (INDEPENDENT_AMBULATORY_CARE_PROVIDER_SITE_OTHER): Payer: Medicare Other | Admitting: Family Medicine

## 2020-10-11 VITALS — BP 146/78 | HR 109 | Temp 98.2°F | Ht 71.0 in | Wt 126.0 lb

## 2020-10-11 DIAGNOSIS — Z23 Encounter for immunization: Secondary | ICD-10-CM

## 2020-10-11 DIAGNOSIS — Z1211 Encounter for screening for malignant neoplasm of colon: Secondary | ICD-10-CM

## 2020-10-11 DIAGNOSIS — I749 Embolism and thrombosis of unspecified artery: Secondary | ICD-10-CM

## 2020-10-11 DIAGNOSIS — Z131 Encounter for screening for diabetes mellitus: Secondary | ICD-10-CM

## 2020-10-11 DIAGNOSIS — Z114 Encounter for screening for human immunodeficiency virus [HIV]: Secondary | ICD-10-CM

## 2020-10-11 DIAGNOSIS — R202 Paresthesia of skin: Secondary | ICD-10-CM

## 2020-10-11 DIAGNOSIS — Z720 Tobacco use: Secondary | ICD-10-CM

## 2020-10-11 DIAGNOSIS — Z1159 Encounter for screening for other viral diseases: Secondary | ICD-10-CM

## 2020-10-11 DIAGNOSIS — R3911 Hesitancy of micturition: Secondary | ICD-10-CM | POA: Diagnosis not present

## 2020-10-11 DIAGNOSIS — Z136 Encounter for screening for cardiovascular disorders: Secondary | ICD-10-CM | POA: Diagnosis not present

## 2020-10-11 DIAGNOSIS — S51801A Unspecified open wound of right forearm, initial encounter: Secondary | ICD-10-CM | POA: Diagnosis not present

## 2020-10-11 DIAGNOSIS — Z0000 Encounter for general adult medical examination without abnormal findings: Secondary | ICD-10-CM

## 2020-10-11 DIAGNOSIS — I10 Essential (primary) hypertension: Secondary | ICD-10-CM

## 2020-10-11 LAB — URINALYSIS, ROUTINE W REFLEX MICROSCOPIC
Bilirubin, UA: NEGATIVE
Glucose, UA: NEGATIVE
Ketones, UA: NEGATIVE
Leukocytes,UA: NEGATIVE
Nitrite, UA: NEGATIVE
Protein,UA: NEGATIVE
RBC, UA: NEGATIVE
Specific Gravity, UA: 1.015 (ref 1.005–1.030)
Urobilinogen, Ur: 0.2 mg/dL (ref 0.2–1.0)
pH, UA: 7 (ref 5.0–7.5)

## 2020-10-11 LAB — MICROALBUMIN, URINE WAIVED
Creatinine, Urine Waived: 100 mg/dL (ref 10–300)
Microalb, Ur Waived: 10 mg/L (ref 0–19)
Microalb/Creat Ratio: <30 mg/g

## 2020-10-11 LAB — BAYER DCA HB A1C WAIVED: HB A1C (BAYER DCA - WAIVED): 5.3 % (ref <7.0)

## 2020-10-11 MED ORDER — FLUTICASONE-SALMETEROL 113-14 MCG/ACT IN AEPB: 1.0000 | INHALATION_SPRAY | Freq: Two times a day (BID) | RESPIRATORY_TRACT | 3 refills | Status: DC

## 2020-10-11 MED ORDER — LISINOPRIL 10 MG PO TABS: 10.0000 mg | ORAL_TABLET | Freq: Every day | ORAL | 1 refills | Status: DC

## 2020-10-11 MED ORDER — IPRATROPIUM BROMIDE 0.02 % IN SOLN: 0.2500 mg | Freq: Four times a day (QID) | RESPIRATORY_TRACT | 6 refills | Status: AC | PRN

## 2020-10-11 MED ORDER — ALBUTEROL SULFATE HFA 108 (90 BASE) MCG/ACT IN AERS: INHALATION_SPRAY | RESPIRATORY_TRACT | 6 refills | Status: DC

## 2020-10-11 MED ORDER — RIVAROXABAN 20 MG PO TABS: ORAL_TABLET | ORAL | 3 refills | Status: DC

## 2020-10-11 NOTE — Assessment & Plan Note (Signed)
Not interested in quitting right now. Continue to monitor. Will get set up for low dose CT and AAA screening. Call with any concerns.
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
## 2020-10-11 NOTE — Patient Instructions (Addendum)
Go get your x-rays done whenever you can.   Preventative Services:  AAA screening: They will call you for your Ultrasound for this Health Risk Assessment and Personalized Prevention Plan: Done today Bone Mass Measurements: N/A CVD Screening: Done today Colon Cancer Screening: They will call you for your colonoscopy Depression Screening: Done today Diabetes Screening: Done today Glaucoma Screening: See your eye doctor Hepatitis B vaccine: N/A Hepatitis C screening: Done today HIV Screening:Done today Flu Vaccine: Get in the fall Lung cancer Screening:They will call you for your CT of your lungs Obesity Screening: Done today Pneumonia Vaccines (2): Done today, 2nd in 1 year STI Screening: N/A PSA screening: Done today   Health Maintenance After Age 66 After age 66, you are at a higher risk for certain long-term diseases and infections as well as injuries from falls. Falls are a major cause of broken bones and head injuries in people who are older than age 66. Getting regular preventive care can help to keep you healthy and well. Preventive care includes getting regular testing and making lifestyle changes as recommended by your health care provider. Talk with your health care provider about:  Which screenings and tests you should have. A screening is a test that checks for a disease when you have no symptoms.  A diet and exercise plan that is right for you. What should I know about screenings and tests to prevent falls? Screening and testing are the best ways to find a health problem early. Early diagnosis and treatment give you the best chance of managing medical conditions that are common after age 40. Certain conditions and lifestyle choices may make you more likely to have a fall. Your health care provider may recommend:  Regular vision checks. Poor vision and conditions such as cataracts can make you more likely to have a fall. If you wear glasses, make sure to get your prescription  updated if your vision changes.  Medicine review. Work with your health care provider to regularly review all of the medicines you are taking, including over-the-counter medicines. Ask your health care provider about any side effects that may make you more likely to have a fall. Tell your health care provider if any medicines that you take make you feel dizzy or sleepy.  Osteoporosis screening. Osteoporosis is a condition that causes the bones to get weaker. This can make the bones weak and cause them to break more easily.  Blood pressure screening. Blood pressure changes and medicines to control blood pressure can make you feel dizzy.  Strength and balance checks. Your health care provider may recommend certain tests to check your strength and balance while standing, walking, or changing positions.  Foot health exam. Foot pain and numbness, as well as not wearing proper footwear, can make you more likely to have a fall.  Depression screening. You may be more likely to have a fall if you have a fear of falling, feel emotionally low, or feel unable to do activities that you used to do.  Alcohol use screening. Using too much alcohol can affect your balance and may make you more likely to have a fall. What actions can I take to lower my risk of falls? General instructions  Talk with your health care provider about your risks for falling. Tell your health care provider if: ? You fall. Be sure to tell your health care provider about all falls, even ones that seem minor. ? You feel dizzy, sleepy, or off-balance.  Take over-the-counter and prescription  medicines only as told by your health care provider. These include any supplements.  Eat a healthy diet and maintain a healthy weight. A healthy diet includes low-fat dairy products, low-fat (lean) meats, and fiber from whole grains, beans, and lots of fruits and vegetables. Home safety  Remove any tripping hazards, such as rugs, cords, and  clutter.  Install safety equipment such as grab bars in bathrooms and safety rails on stairs.  Keep rooms and walkways well-lit. Activity  Follow a regular exercise program to stay fit. This will help you maintain your balance. Ask your health care provider what types of exercise are appropriate for you.  If you need a cane or walker, use it as recommended by your health care provider.  Wear supportive shoes that have nonskid soles.   Lifestyle  Do not drink alcohol if your health care provider tells you not to drink.  If you drink alcohol, limit how much you have: ? 0-1 drink a day for women. ? 0-2 drinks a day for men.  Be aware of how much alcohol is in your drink. In the [place] one drink equals one typical bottle of beer (12 oz), one-half glass of wine (5 oz), or one shot of hard liquor (1 oz).  Do not use any products that contain nicotine or tobacco, such as cigarettes and e-cigarettes. If you need help quitting, ask your health care provider. Summary  Having a healthy lifestyle and getting preventive care can help to protect your health and wellness after age 66.  Screening and testing are the best way to find a health problem early and help you avoid having a fall. Early diagnosis and treatment give you the best chance for managing medical conditions that are more common for people who are older than age 66.  Falls are a major cause of broken bones and head injuries in people who are older than age 66. Take precautions to prevent a fall at home.  Work with your health care provider to learn what changes you can make to improve your health and wellness and to prevent falls. This information is not intended to replace advice given to you by your health care provider. Make sure you discuss any questions you have with your health care provider. Document Revised: 10/20/2018 Document Reviewed: 05/12/2017 Elsevier Patient Education  2021 Elsevier Inc. Td (Tetanus, Diphtheria)  Vaccine: What You Need to Know 1. Why get vaccinated? Td vaccine can prevent tetanus and diphtheria. Tetanus enters the body through cuts or wounds. Diphtheria spreads from person to person.  TETANUS (T) causes painful stiffening of the muscles. Tetanus can lead to serious health problems, including being unable to open the mouth, having trouble swallowing and breathing, or death.  DIPHTHERIA (D) can lead to difficulty breathing, heart failure, paralysis, or death. 2. Td vaccine Td is only for children 7 years and older, adolescents, and adults.  Td is usually given as a booster dose every 10 years, or after 5 years in the case of a severe or dirty wound or burn. Another vaccine, called "Tdap," may be used instead of Td. Tdap protects against pertussis, also known as "whooping cough," in addition to tetanus and diphtheria. Td may be given at the same time as other vaccines. 3. Talk with your health care provider Tell your vaccination provider if the person getting the vaccine:  Has had an allergic reaction after a previous dose of any vaccine that protects against tetanus or diphtheria, or has any severe, life-threatening allergies  Has ever had Guillain-Barr Syndrome (also called "GBS")  Has had severe pain or swelling after a previous dose of any vaccine that protects against tetanus or diphtheria In some cases, your health care provider may decide to postpone Td vaccination until a future visit. People with minor illnesses, such as a cold, may be vaccinated. People who are moderately or severely ill should usually wait until they recover before getting Td vaccine.  Your health care provider can give you more information. 4. Risks of a vaccine reaction  Pain, redness, or swelling where the shot was given, mild fever, headache, feeling tired, and nausea, vomiting, diarrhea, or stomachache sometimes happen after Td vaccination. People sometimes faint after medical procedures, including  vaccination. Tell your provider if you feel dizzy or have vision changes or ringing in the ears.  As with any medicine, there is a very remote chance of a vaccine causing a severe allergic reaction, other serious injury, or death. 5. What if there is a serious problem? An allergic reaction could occur after the vaccinated person leaves the clinic. If you see signs of a severe allergic reaction (hives, swelling of the face and throat, difficulty breathing, a fast heartbeat, dizziness, or weakness), call 9-1-1 and get the person to the nearest hospital.  For other signs that concern you, call your health care provider.  Adverse reactions should be reported to the Vaccine Adverse Event Reporting System (VAERS). Your health care provider will usually file this report, or you can do it yourself. Visit the VAERS website at www.vaers.LAgents.no or call 340-265-3587. VAERS is only for reporting reactions, and VAERS staff members do not give medical advice. 6. The National Vaccine Injury Compensation Program The [employer] Vaccine Injury Compensation Program (VICP) is a federal program that was created to compensate people who may have been injured by certain vaccines. Claims regarding alleged injury or death due to vaccination have a time limit for filing, which may be as short as two years. Visit the VICP website at SpiritualWord.at or call (662)678-8109 to learn about the program and about filing a claim. 7. How can I learn more?  Ask your health care provider.  Call your local or state health department.  Visit the website of the Food and Drug Administration (FDA) for vaccine package inserts and additional information at FinderList.no.  Contact the Centers for Disease Control and Prevention (CDC): ? Call (469) 041-6204 (1-800-CDC-INFO) or ? Visit CDC's website at PicCapture.uy. Vaccine Information Statement Td (Tetanus, Diphtheria) Vaccine  (02/16/2020) This information is not intended to replace advice given to you by your health care provider. Make sure you discuss any questions you have with your health care provider. Document Revised: 04/04/2020 Document Reviewed: 04/04/2020 Elsevier Patient Education  2021 Elsevier Inc. Pneumococcal Conjugate Vaccine (PCV13): What You Need to Know 1. Why get vaccinated? Pneumococcal conjugate vaccine (PCV13) can prevent pneumococcal disease. Pneumococcal disease refers to any illness caused by pneumococcal bacteria. These bacteria can cause many types of illnesses, including pneumonia, which is an infection of the lungs. Pneumococcal bacteria are one of the most common causes of pneumonia. Besides pneumonia, pneumococcal bacteria can also cause:  Ear infections  Sinus infections  Meningitis (infection of the tissue covering the brain and spinal cord)  Bacteremia (infection of the blood) Anyone can get pneumococcal disease, but children under 46 years old, people with certain medical conditions, adults 65 years or older, and cigarette smokers are at the highest risk. Most pneumococcal infections are mild. However, some can result in long-term problems,  such as brain damage or hearing loss. Meningitis, bacteremia, and pneumonia caused by pneumococcal disease can be fatal. 2. PCV13 PCV13 protects against 13 types of bacteria that cause pneumococcal disease. Infants and young children usually need 4 doses of pneumococcal conjugate vaccine, at ages 51, 2, 47, and 12-15 months. Older children (through age 28 months) may be vaccinated if they did not receive the recommended doses. A dose of PCV13 is also recommended for adults and children 6 years or older with certain medical conditions if they did not already receive PCV13. This vaccine may be given to healthy adults 65 years or older who did not already receive PCV13, based on discussions between the patient and health care provider. 3. Talk with  your health care provider Tell your vaccination provider if the person getting the vaccine:  Has had an allergic reaction after a previous dose of PCV13, to an earlier pneumococcal conjugate vaccine known as PCV7, or to any vaccine containing diphtheria toxoid (for example, DTaP), or has any severe, life-threatening allergies In some cases, your health care provider may decide to postpone PCV13 vaccination until a future visit. People with minor illnesses, such as a cold, may be vaccinated. People who are moderately or severely ill should usually wait until they recover before getting PCV13. Your health care provider can give you more information. 4. Risks of a vaccine reaction  Redness, swelling, pain, or tenderness where the shot is given, and fever, loss of appetite, fussiness (irritability), feeling tired, headache, and chills can happen after PCV13 vaccination. Young children may be at increased risk for seizures caused by fever after PCV13 if it is administered at the same time as inactivated influenza vaccine. Ask your health care provider for more information. People sometimes faint after medical procedures, including vaccination. Tell your provider if you feel dizzy or have vision changes or ringing in the ears. As with any medicine, there is a very remote chance of a vaccine causing a severe allergic reaction, other serious injury, or death. 5. What if there is a serious problem? An allergic reaction could occur after the vaccinated person leaves the clinic. If you see signs of a severe allergic reaction (hives, swelling of the face and throat, difficulty breathing, a fast heartbeat, dizziness, or weakness), call 9-1-1 and get the person to the nearest hospital. For other signs that concern you, call your health care provider. Adverse reactions should be reported to the Vaccine Adverse Event Reporting System (VAERS). Your health care provider will usually file this report, or you can do it  yourself. Visit the VAERS website at www.vaers.LAgents.no or call 667-681-1914. VAERS is only for reporting reactions, and VAERS staff members do not give medical advice. 6. The National Vaccine Injury Compensation Program The [employer] Vaccine Injury Compensation Program (VICP) is a federal program that was created to compensate people who may have been injured by certain vaccines. Claims regarding alleged injury or death due to vaccination have a time limit for filing, which may be as short as two years. Visit the VICP website at SpiritualWord.at or call 905-853-0321 to learn about the program and about filing a claim. 7. How can I learn more?  Ask your health care provider.  Call your local or state health department.  Visit the website of the Food and Drug Administration (FDA) for vaccine package inserts and additional information at FinderList.no.  Contact the Centers for Disease Control and Prevention (CDC): ? Call 714-082-8238 (1-800-CDC-INFO) or ? Visit CDC's website at PicCapture.uy. Vaccine Information  Statement PCV13 (02/16/2020) This information is not intended to replace advice given to you by your health care provider. Make sure you discuss any questions you have with your health care provider. Document Revised: 04/04/2020 Document Reviewed: 04/04/2020 Elsevier Patient Education  2021 Elsevier Inc.  Lung Cancer Screening A lung cancer screening is a test that checks for lung cancer. Lung cancer screening is done to look for lung cancer in its very early stages when you are not likely to have any symptoms and before it spreads beyond the lung, making it harder to treat. Finding cancer early improves the chances of successful treatment. It may save your life. Who should have screening? You should be screened for lung cancer if all of these apply:  You currently smoke or you have quit smoking within the past 15 years.  You  are 55-8 years old. Screening may be recommended up to age 84 depending on your overall health and other factors.  You are in good general health.  You have a smoking history of 1 pack of cigarettes a day for 20 years or 2 packs a day for 10 years. Screening may also be recommended if you are at high risk for the disease. You may be at high risk if:  You have a family history of lung cancer.  You have been exposed to asbestos or radon.  You have chronic obstructive pulmonary disease (COPD). How is screening done? The recommended screening test is a low-dose computed tomography (LDCT) scan. This scan takes detailed images of the lungs. This allows a health care provider to look for abnormal cells. If you are at risk for lung cancer, it is recommended that you get screened once a year. Talk to your health care provider about the risks, benefits, and limitations of screening.   What are the benefits of screening? Screening can find lung cancer early, before symptoms start and before it has spread outside of the lungs. The chances of curing lung cancer are greater if the cancer is diagnosed early. What are the risks of screening?  The screening may show lung cancer when no cancer is present (false-positive result).  The screening may not find lung cancer when it is present.  The person gets exposed to radiation. How can I lower my risk of lung cancer? Make these lifestyle changes to lower your risk of developing lung cancer:  Do not use any products that contain nicotine or tobacco, such as cigarettes, e-cigarettes, and chewing tobacco. If you need help quitting, ask your health care provider.  Avoid secondhand smoke.  Avoid exposure to radiation.  Avoid exposure to radon gas. Have your home checked for radon regularly.  Avoid things that cause cancer (carcinogens).  Avoid living or working in places with high air pollution. Questions to ask your health care provider  Am I  eligible for lung cancer screening?  Does my health insurance cover the cost of lung cancer screening?  What happens if the lung cancer screening shows something of concern?  How soon will I have results from my lung cancer screening?  Is there anything that I need to do to prepare for my lung cancer screening?  What happens if I decide not to have lung cancer screening? Where to find more information Ask your health care provider about the risks and benefits of screening. More information and resources are available from these organizations:  American Cancer Society (ACS): www.cancer.org  American Lung Association: www.lung.org Contact a health care provider if:  You start to show symptoms of lung cancer, including: ? Coughing that will not go away. ? Making whistling sounds when you breathe (wheezing). ? Chest pain. ? Coughing up blood. ? Shortness of breath. ? Weight loss that cannot be explained. ? Constant tiredness (fatigue). ? Hoarse voice. Summary  Lung cancer screening may find lung cancer before symptoms appear. Finding cancer early improves the chances of successful treatment. It may save your life.  The recommended screening test is a low-dose computed tomography (LDCT) scan that looks for abnormal cells in the lungs. If you are at risk for lung cancer, it is recommended that you get screened once a year.  You can make lifestyle changes to lower your risk of lung cancer.  Ask your health care provider about the risks and benefits of screening. This information is not intended to replace advice given to you by your health care provider. Make sure you discuss any questions you have with your health care provider. Document Revised: 11/20/2019 Document Reviewed: 06/27/2019 Elsevier Patient Education  2021 [employer].  https://www.cancer.org/cancer/colon-rectal-cancer/causes-risks-prevention/risk-factors.html">  Colorectal Cancer Screening  Colorectal cancer  screening is a group of tests that are used to check for colorectal cancer before symptoms develop. Colorectal refers to the colon and rectum. The colon and rectum are located at the end of the digestive tract and carry stool (feces) out of the body. Who should have screening? All adults who are 75-13 years old should have screening. Your health care provider may recommend screening before age 7. You will have tests every 1-10 years, depending on your results and the type of screening test. Screening recommendations for adults who are 38-4 years old vary depending on a person's health. People older than age 48 should no longer get colorectal cancer screening. You may have screening tests starting before age 18, or more often than other people, if you have any of these risk factors:  A personal or family history of colorectal cancer or abnormal growths known as polyps in your colon.  Inflammatory bowel disease, such as ulcerative colitis or Crohn's disease.  A history of having radiation treatment to the abdomen or the area between the hip bones (pelvic area) for cancer.  A type of genetic syndrome that is passed from parent to child (hereditary), such as: ? Lynch syndrome. ? Familial adenomatous polyposis. ? Turcot syndrome. ? Peutz-Jeghers syndrome. ? MUTYH-associated polyposis (MAP).  A personal history of diabetes. Types of tests There are several types of colorectal screening tests. You may have one or more of the following:  Guaiac-based fecal occult blood testing. For this test, a stool sample is checked for hidden (occult) blood, which could be a sign of colorectal cancer.  Fecal immunochemical test (FIT). For this test, a stool sample is checked for blood, which could be a sign of colorectal cancer.  Stool DNA test. For this test, a stool sample is checked for blood and changes in DNA that could lead to colorectal cancer.  Sigmoidoscopy. During this test, a thin, flexible tube  with a camera on the end, called a sigmoidoscope, is used to examine the rectum and the lower colon.  Colonoscopy. During this test, a long, flexible tube with a camera on the end, called a colonoscope, is used to examine the entire colon and rectum. Also, sometimes a tissue sample is taken to be looked at under a microscope (biopsy) or small polyps are removed during this test.  Virtual colonoscopy. Instead of a colonoscope, this type of colonoscopy uses  a CT scan to take pictures of the colon and rectum. A CT scan is a type of X-ray that is made using computers. What are the benefits of screening? Screening reduces your risk for colorectal cancer and can help identify cancer at an early stage, when the cancer can be removed or treated more easily. It is common for polyps to form in the lining of the colon, especially as you age. These polyps may be cancerous or become cancerous over time. Screening can identify these polyps. What are the risks of screening? Generally, these are safe tests. However, problems may occur, including:  The need for more tests to confirm results from a stool sample test. Stool sample tests have fewer risks than other types of screening tests.  Being exposed to low levels of radiation, if you had a test involving X-rays. This may slightly increase your cancer risk. The benefit of detecting cancer outweighs the slight increase in risk.  Bleeding, damage to the intestine, or infection caused by a sigmoidoscopy or colonoscopy.  A reaction to medicines given during a sigmoidoscopy or colonoscopy. Talk with your health care provider to understand your risk for colorectal cancer and to make a screening plan that is right for you. Questions to ask your health care provider  When should I start colorectal cancer screening?  What is my risk for colorectal cancer?  How often do I need screening?  Which screening tests do I need?  How do I get my test results?  What do  my results mean? Where to find more information Learn more about colorectal cancer screening from:  The American Cancer Society: cancer.org  National Cancer Institute: cancer.gov Summary  Colorectal cancer screening is a group of tests used to check for colorectal cancer before symptoms develop.  All adults who are 19-30 years old should have screening. Your health care provider may recommend screening before age 1.  You may have screening tests starting before age 84, or more often than other people, if you have certain risk factors.  Screening reduces your risk for colorectal cancer and can help identify cancer at an early stage, when the cancer can be removed or treated more easily.  Talk with your health care provider to understand your risk for colorectal cancer and to make a screening plan that is right for you. This information is not intended to replace advice given to you by your health care provider. Make sure you discuss any questions you have with your health care provider. Document Revised: 10/18/2019 Document Reviewed: 10/18/2019 Elsevier Patient Education  2021 [employer].

## 2020-10-11 NOTE — Assessment & Plan Note (Signed)
Under good control on current regimen. Continue current regimen. Continue to monitor. Call with any concerns. Refills given. Labs drawn today.   

## 2020-10-12 ENCOUNTER — Encounter: Payer: Self-pay | Admitting: Family Medicine

## 2020-10-12 LAB — LIPID PANEL W/O CHOL/HDL RATIO
Cholesterol, Total: 177 mg/dL (ref 100–199)
HDL: 61 mg/dL (ref >39)
LDL Chol Calc (NIH): 94 mg/dL (ref 0–99)
Triglycerides: 123 mg/dL (ref 0–149)
VLDL Cholesterol Cal: 22 mg/dL (ref 5–40)

## 2020-10-12 LAB — CBC WITH DIFFERENTIAL/PLATELET
Basophils Absolute: 0.1 10*3/uL (ref 0.0–0.2)
Basos: 1 %
EOS (ABSOLUTE): 0.2 10*3/uL (ref 0.0–0.4)
Eos: 3 %
Hematocrit: 44.7 % (ref 37.5–51.0)
Hemoglobin: 14.7 g/dL (ref 13.0–17.7)
Immature Grans (Abs): 0 10*3/uL (ref 0.0–0.1)
Immature Granulocytes: 0 %
Lymphocytes Absolute: 0.9 10*3/uL (ref 0.7–3.1)
Lymphs: 14 %
MCH: 28.8 pg (ref 26.6–33.0)
MCHC: 32.9 g/dL (ref 31.5–35.7)
MCV: 88 fL (ref 79–97)
Monocytes Absolute: 0.8 10*3/uL (ref 0.1–0.9)
Monocytes: 12 %
Neutrophils Absolute: 4.3 10*3/uL (ref 1.4–7.0)
Neutrophils: 70 %
Platelets: 425 10*3/uL (ref 150–450)
RBC: 5.1 x10E6/uL (ref 4.14–5.80)
RDW: 16.1 % — ABNORMAL HIGH (ref 11.6–15.4)
WBC: 6.2 10*3/uL (ref 3.4–10.8)

## 2020-10-12 LAB — COMPREHENSIVE METABOLIC PANEL
ALT: 16 IU/L (ref 0–44)
AST: 16 IU/L (ref 0–40)
Albumin/Globulin Ratio: 1.3 (ref 1.2–2.2)
Albumin: 4 g/dL (ref 3.8–4.8)
Alkaline Phosphatase: 120 IU/L (ref 44–121)
BUN/Creatinine Ratio: 7 — ABNORMAL LOW (ref 10–24)
BUN: 5 mg/dL — ABNORMAL LOW (ref 8–27)
Bilirubin Total: 0.4 mg/dL (ref 0.0–1.2)
CO2: 22 mmol/L (ref 20–29)
Calcium: 9 mg/dL (ref 8.6–10.2)
Chloride: 101 mmol/L (ref 96–106)
Creatinine, Ser: 0.76 mg/dL (ref 0.76–1.27)
Globulin, Total: 3.1 g/dL (ref 1.5–4.5)
Glucose: 94 mg/dL (ref 65–99)
Potassium: 4 mmol/L (ref 3.5–5.2)
Sodium: 139 mmol/L (ref 134–144)
Total Protein: 7.1 g/dL (ref 6.0–8.5)
eGFR: 100 mL/min/{1.73_m2} (ref >59)

## 2020-10-12 LAB — HEPATITIS C ANTIBODY: Hep C Virus Ab: <0.1 s/co ratio (ref 0.0–0.9)

## 2020-10-12 LAB — TSH: TSH: 3.53 u[IU]/mL (ref 0.450–4.500)

## 2020-10-12 LAB — HIV ANTIBODY (ROUTINE TESTING W REFLEX): HIV Screen 4th Generation wRfx: NONREACTIVE

## 2020-10-12 LAB — PSA: Prostate Specific Ag, Serum: 0.7 ng/mL (ref 0.0–4.0)

## 2020-10-13 NOTE — Progress Notes (Signed)
Interpreted by me on 10/11/20. Sinus tachycardia at 106bpm, no st segment changes.

## 2020-10-16 ENCOUNTER — Ambulatory Visit: Payer: Medicare Other | Attending: Family Medicine

## 2020-10-16 ENCOUNTER — Telehealth: Payer: Self-pay

## 2020-10-16 NOTE — Telephone Encounter (Signed)
Routing to provider  

## 2020-10-16 NOTE — Telephone Encounter (Signed)
Copied from CRM [id number]. Topic: General - Other >> Oct 16, 2020 11:09 AM Wyonia Hough E wrote: Reason for CRM: Selena Batten called and stated that the pt did not show up today for his ultrasound/FYI   FYI

## 2020-10-17 NOTE — Telephone Encounter (Signed)
Noted. Thanks.

## 2020-10-21 ENCOUNTER — Telehealth: Payer: Self-pay | Admitting: *Deleted

## 2020-10-21 NOTE — Telephone Encounter (Signed)
Received referral for low dose lung cancer screening CT scan. Message left at phone number listed in EMR for patient to call me back to facilitate scheduling scan.  

## 2020-10-29 ENCOUNTER — Telehealth: Payer: Self-pay | Admitting: *Deleted

## 2020-10-29 ENCOUNTER — Encounter: Payer: Self-pay | Admitting: *Deleted

## 2020-10-29 DIAGNOSIS — Z122 Encounter for screening for malignant neoplasm of respiratory organs: Secondary | ICD-10-CM

## 2020-10-29 DIAGNOSIS — Z87891 Personal history of nicotine dependence: Secondary | ICD-10-CM

## 2020-10-29 DIAGNOSIS — F17200 Nicotine dependence, unspecified, uncomplicated: Secondary | ICD-10-CM

## 2020-10-29 NOTE — Telephone Encounter (Signed)
Received referral for initial lung cancer screening scan. Contacted patient and obtained smoking history,(current smoker, 1 ppd x 20 yrs) as well as answering questions related to screening process. Patient denies signs of lung cancer such as weight loss or hemoptysis. Patient denies comorbidity that would prevent curative treatment if lung cancer were found. Patient is scheduled for shared decision making visit and CT scan on 12/04/20 @ 10:00 am.

## 2020-10-30 ENCOUNTER — Encounter: Payer: Self-pay | Admitting: *Deleted

## 2020-11-08 ENCOUNTER — Ambulatory Visit: Payer: Medicare Other | Admitting: Family Medicine

## 2020-11-22 ENCOUNTER — Ambulatory Visit: Payer: Medicare Other | Admitting: Family Medicine

## 2020-12-03 ENCOUNTER — Telehealth: Payer: Medicare Other | Admitting: Hospice and Palliative Medicine

## 2020-12-04 ENCOUNTER — Ambulatory Visit: Admission: RE | Admit: 2020-12-04 | Payer: Medicare Other | Source: Ambulatory Visit

## 2020-12-04 ENCOUNTER — Inpatient Hospital Stay: Payer: Medicare Other | Admitting: Hospice and Palliative Medicine

## 2020-12-05 ENCOUNTER — Ambulatory Visit: Payer: Medicare Other | Admitting: Family Medicine

## 2020-12-16 ENCOUNTER — Other Ambulatory Visit: Payer: Self-pay | Admitting: Family Medicine

## 2020-12-18 ENCOUNTER — Ambulatory Visit: Payer: Medicare Other | Admitting: Family Medicine

## 2020-12-18 ENCOUNTER — Inpatient Hospital Stay: Payer: Medicare Other | Admitting: Hospice and Palliative Medicine

## 2020-12-18 ENCOUNTER — Ambulatory Visit: Admission: RE | Admit: 2020-12-18 | Payer: Medicare Other | Source: Ambulatory Visit

## 2021-02-05 ENCOUNTER — Other Ambulatory Visit: Payer: Self-pay | Admitting: Family Medicine

## 2021-02-05 NOTE — Telephone Encounter (Signed)
Requested Prescriptions  Pending Prescriptions Disp Refills  . albuterol (VENTOLIN HFA) 108 (90 Base) MCG/ACT inhaler [Pharmacy Med Name: ALBUTEROL HFA INH(200 PUFFS)18GM] 18 g 6    Sig: INHALE 2 PUFFS BY MOUTH EVERY 6 HOURS AS NEEDED FOR WHEEZING OR SHORTNESS OF BREATH     Pulmonology:  Beta Agonists Failed - 02/05/2021  7:24 PM      Failed - One inhaler should last at least one month. If the patient is requesting refills earlier, contact the patient to check for uncontrolled symptoms.      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 [place], Megan P, DO   1 year ago Simple chronic bronchitis (HCC)   [facility] [place], [place], DO

## 2021-03-09 ENCOUNTER — Other Ambulatory Visit: Payer: Self-pay | Admitting: Family Medicine

## 2021-03-09 NOTE — Telephone Encounter (Signed)
last RF 02/05/21  18 g  6 RF- too soon

## 2021-04-03 ENCOUNTER — Other Ambulatory Visit: Payer: Self-pay | Admitting: Family Medicine

## 2021-05-03 ENCOUNTER — Other Ambulatory Visit: Payer: Self-pay | Admitting: Family Medicine

## 2021-05-04 NOTE — Telephone Encounter (Signed)
Pharmacy sent refill request fir Albuterol. Called pharmacy and spoke with Selena Batten. Advised refills were sent 04/03/21 8.5 g with 2 RF. Selena Batten stated she will cancel the request.

## 2021-05-05 NOTE — Telephone Encounter (Signed)
Fyi.

## 2021-05-21 ENCOUNTER — Other Ambulatory Visit: Payer: Self-pay | Admitting: Family Medicine

## 2021-05-21 NOTE — Telephone Encounter (Signed)
Routing to provider  

## 2021-05-21 NOTE — Telephone Encounter (Signed)
Copied from CRM [id number]. Topic: General - Other >> May 21, 2021  1:55 PM Marylen Ponto wrote: Reason for CRM: Pt stated he was previously prescribed a medication for COPD but he was unable to afford it so he never got it. Pt stated that he is able to afford it now but when he went to his pharmacy they no longer had the Rx. Pt requests that the Rx be sent to [id number] - [place], [place] - [address] AT [facility] [address] Phone: [phone]  Fax: [phone]

## 2021-05-23 MED ORDER — FLUTICASONE-SALMETEROL 113-14 MCG/ACT IN AEPB: 1.0000 | INHALATION_SPRAY | Freq: Two times a day (BID) | RESPIRATORY_TRACT | 3 refills | Status: DC

## 2021-05-23 MED ORDER — ALBUTEROL SULFATE HFA 108 (90 BASE) MCG/ACT IN AERS: INHALATION_SPRAY | RESPIRATORY_TRACT | 2 refills | Status: DC

## 2021-06-03 ENCOUNTER — Other Ambulatory Visit: Payer: Self-pay | Admitting: Family Medicine

## 2021-06-03 NOTE — Telephone Encounter (Signed)
Requested medications are due for refill today requesting inhaler early  Requested medications are on the active medication list yes  Last refill 05/12/21  Last visit 10/11/20  Future visit scheduled NO, NO SHOW 12/18/20  Notes to clinic protocol dictates to inform if requesting inhaler early, please assess. Requested Prescriptions  Pending Prescriptions Disp Refills   albuterol (VENTOLIN HFA) 108 (90 Base) MCG/ACT inhaler [Pharmacy Med Name: ALBUTEROL HFA INH (200 PUFFS)8.5GM] 8.5 g 2    Sig: INHALE 2 PUFFS INTO THE LUNGS EVERY 6 HOURS AS NEEDED FOR WHEEZING OR SHORTNESS OF BREATH     Pulmonology:  Beta Agonists Failed - 06/03/2021  3:40 AM      Failed - One inhaler should last at least one month. If the patient is requesting refills earlier, contact the patient to check for uncontrolled symptoms.      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

## 2021-07-20 ENCOUNTER — Other Ambulatory Visit: Payer: Self-pay | Admitting: Family Medicine

## 2021-07-20 NOTE — Telephone Encounter (Signed)
Requested Prescriptions  Pending Prescriptions Disp Refills   albuterol (VENTOLIN HFA) 108 (90 Base) MCG/ACT inhaler [Pharmacy Med Name: ALBUTEROL HFA INH (200 PUFFS)8.5GM] 8.5 g 1    Sig: INHALE 2 PUFFS INTO THE LUNGS EVERY 6 HOURS AS NEEDED FOR WHEEZING OR SHORTNESS OF BREATH     Pulmonology:  Beta Agonists Failed - 07/20/2021  3:18 AM      Failed - One inhaler should last at least one month. If the patient is requesting refills earlier, contact the patient to check for uncontrolled symptoms.      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

## 2021-08-04 ENCOUNTER — Other Ambulatory Visit: Payer: Self-pay | Admitting: Family Medicine

## 2021-08-05 ENCOUNTER — Encounter: Payer: Self-pay | Admitting: Nurse Practitioner

## 2021-08-05 ENCOUNTER — Ambulatory Visit (INDEPENDENT_AMBULATORY_CARE_PROVIDER_SITE_OTHER): Payer: Medicare Other | Admitting: Nurse Practitioner

## 2021-08-05 DIAGNOSIS — J410 Simple chronic bronchitis: Secondary | ICD-10-CM

## 2021-08-05 MED ORDER — ALBUTEROL SULFATE HFA 108 (90 BASE) MCG/ACT IN AERS: 2.0000 | INHALATION_SPRAY | Freq: Four times a day (QID) | RESPIRATORY_TRACT | 4 refills | Status: DC | PRN

## 2021-08-05 NOTE — Patient Instructions (Signed)
COPD and Physical Activity Chronic obstructive pulmonary disease (COPD) is a long-term, or chronic, condition that affects the lungs. COPD is a general term that can be used to describe many problems that cause inflammation of the lungs and limit airflow. These conditions include chronic bronchitis and emphysema. The main symptom of COPD is shortness of breath, which makes it harder to do even simple tasks. This can also make it harder to exercise and stay active. Talk with your health care provider about treatments to help you breathe better and actions you can take to prevent breathing problems during physical activity. What are the benefits of exercising when you have COPD? Exercising regularly is an important part of a healthy lifestyle. You can still exercise and do physical activities even though you have COPD. Exercise and physical activity improve your shortness of breath by increasing blood flow (circulation). This causes your heart to pump more oxygen through your body. Moderate exercise can: Improve oxygen use. Increase your energy level. Help with shortness of breath. Strengthen your breathing muscles. Improve heart health. Help with sleep. Improve your self-esteem and feelings of self-worth. Lower depression, stress, and anxiety. Exercise can benefit everyone with COPD. The severity of your disease may affect how hard you can exercise, especially at first, but everyone can benefit. Talk with your health care provider about how much exercise is safe for you, and which activities and exercises are safe for you. What actions can I take to prevent breathing problems during physical activity? Sign up for a pulmonary rehabilitation program. This type of program may include: Education about lung diseases. Exercise classes that teach you how to exercise and be more active while improving your breathing. This usually involves: Exercise using your lower extremities, such as a stationary  bicycle. About 30 minutes of exercise, 2 to 5 times per week, for 6 to 12 weeks. Strength training, such as push-ups or leg lifts. Nutrition education. Group classes in which you can talk with others who also have COPD and learn ways to manage stress. If you use an oxygen tank, you should use it while you exercise. Work with your health care provider to adjust your oxygen for your physical activity. Your resting flow rate is different from your flow rate during physical activity. How to manage your breathing while exercising While you are exercising: Take slow breaths. Pace yourself, and do nottry to go too fast. Purse your lips while breathing out. Pursing your lips is similar to a kissing or whistling position. If doing exercise that uses a quick burst of effort, such as weight lifting: Breathe in before starting the exercise. Breathe out during the hardest part of the exercise, such as raising the weights. Where to find support You can find support for exercising with COPD from: Your health care provider. A pulmonary rehabilitation program. Your local health department or community health programs. Support groups, either online or in-person. Your health care provider may be able to recommend support groups. Where to find more information You can find more information about exercising with COPD from: American Lung Association: lung.org COPD Foundation: copdfoundation.org Contact a health care provider if: Your symptoms get worse. You have nausea. You have a fever. You want to start a new exercise program or a new activity. Get help right away if: You have chest pain. You cannot breathe. These symptoms may represent a serious problem that is an emergency. Do not wait to see if the symptoms will go away. Get medical help right away. Call   your local emergency services (911 in the [place] Do not drive yourself to the hospital. Summary COPD is a general term that can be used to describe  many different lung problems that cause lung inflammation and limit airflow. This includes chronic bronchitis and emphysema. Exercise and physical activity improve your shortness of breath by increasing blood flow (circulation). This causes your heart to provide more oxygen to your body. Contact your health care provider before starting any exercise program or new activity. Ask your health care provider what exercises and activities are safe for you. This information is not intended to replace advice given to you by your health care provider. Make sure you discuss any questions you have with your health care provider. Document Revised: 05/07/2020 Document Reviewed: 05/07/2020 Elsevier Patient Education  2022 Elsevier Inc.  

## 2021-08-05 NOTE — Progress Notes (Signed)
There were no vitals taken for this visit.   Subjective:    Patient ID: Steve Chaney, male    DOB: 12/08/1955, 66 y.o.   MRN: [id number]  HPI: Steve Chaney is a 66 y.o. male  Chief Complaint  Patient presents with   Medication Refill    Patient is requesting a refill for Albuterol and states he is doing well with all his other medications. 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 Patient denies having any other concerns at today's visit.    This visit was completed via telephone due to the restrictions of the COVID-19 pandemic. All issues as above were discussed and addressed but no physical exam was performed. If it was felt that the patient should be evaluated in the office, they were directed there. The patient verbally consented to this visit. Patient was unable to complete an audio/visual visit due to Lack of equipment. Due to the catastrophic nature of the COVID-19 pandemic, this visit was done through audio contact only. Location of the patient: home Location of the provider: work Those involved with this call:  Provider: Marnee Guarneri, DNP CMA: Irena Reichmann, Am[place]ita Desk/Registration: [employer]  Time spent on call:  21 minutes on the phone discussing health concerns. 15 minutes total spent in review of patient's record and preparation of their chart.  I verified patient identity using two factors (patient name and date of birth). Patient consents verbally to being seen via telemedicine visit today.    COPD Presents today for refill on Albuterol -- reports he is up to date on on other medication refills. Continues on daily inhaler, which offers benefit.  He reports the daily inhaler has helped him to surf.  Only smokes occasionally, a couple cigarettes here and there.  Currently is helping a couple Administrator, [employer] companies to build. COPD status: controlled Satisfied with current treatment?: yes Oxygen use: no Dyspnea frequency: none Cough  frequency: none Rescue inhaler frequency:  rarely Limitation of activity: no Productive cough: none Last Spirometry: 2016 Pneumovax: Up to Date Influenza: Not up to Date  Relevant past medical, surgical, family and social history reviewed and updated as indicated. Interim medical history since our last visit reviewed. Allergies and medications reviewed and updated.  Review of Systems  Constitutional:  Negative for activity change, diaphoresis, fatigue and fever.  Respiratory:  Negative for cough, chest tightness, shortness of breath and wheezing.   Cardiovascular:  Negative for chest pain, palpitations and leg swelling.  Gastrointestinal: Negative.   Endocrine: Negative for cold intolerance, heat intolerance, polydipsia, polyphagia and polyuria.  Psychiatric/Behavioral: Negative.     Per HPI unless specifically indicated above     Objective:    There were no vitals taken for this visit.  Wt Readings from Last 3 Encounters:  10/11/20 126 lb (57.2 kg)  09/13/20 127 lb 11.2 oz (57.9 kg)  09/13/20 128 lb 6.4 oz (58.2 kg)    Physical Exam  Telephone visit only, unable to perform physical exam.  Results for orders placed or performed in visit on 10/11/20  Comprehensive metabolic panel  Result Value Ref Range   Glucose 94 65 - 99 mg/dL   BUN 5 (L) 8 - 27 mg/dL   Creatinine, Ser 0.76 0.76 - 1.27 mg/dL   eGFR 100 >59 mL/min/1.73   BUN/Creatinine Ratio 7 (L) 10 - 24   Sodium 139 134 - 144 mmol/L   Potassium 4.0 3.5 - 5.2 mmol/L   Chloride 101 96 -  106 mmol/L   CO2 22 20 - 29 mmol/L   Calcium 9.0 8.6 - 10.2 mg/dL   Total Protein 7.1 6.0 - 8.5 g/dL   Albumin 4.0 3.8 - 4.8 g/dL   Globulin, Total 3.1 1.5 - 4.5 g/dL   Albumin/Globulin Ratio 1.3 1.2 - 2.2   Bilirubin Total 0.4 0.0 - 1.2 mg/dL   Alkaline Phosphatase 120 44 - 121 IU/L   AST 16 0 - 40 IU/L   ALT 16 0 - 44 IU/L  CBC with Differential/Platelet  Result Value Ref Range   WBC 6.2 3.4 - 10.8 x10E3/uL   RBC 5.10 4.14  - 5.80 x10E6/uL   Hemoglobin 14.7 13.0 - 17.7 g/dL   Hematocrit 44.7 37.5 - 51.0 %   MCV 88 79 - 97 fL   MCH 28.8 26.6 - 33.0 pg   MCHC 32.9 31.5 - 35.7 g/dL   RDW 16.1 (H) 11.6 - 15.4 %   Platelets 425 150 - 450 x10E3/uL   Neutrophils 70 Not Estab. %   Lymphs 14 Not Estab. %   Monocytes 12 Not Estab. %   Eos 3 Not Estab. %   Basos 1 Not Estab. %   Neutrophils Absolute 4.3 1.4 - 7.0 x10E3/uL   Lymphocytes Absolute 0.9 0.7 - 3.1 x10E3/uL   Monocytes Absolute 0.8 0.1 - 0.9 x10E3/uL   EOS (ABSOLUTE) 0.2 0.0 - 0.4 x10E3/uL   Basophils Absolute 0.1 0.0 - 0.2 x10E3/uL   Immature Granulocytes 0 Not Estab. %   Immature Grans (Abs) 0.0 0.0 - 0.1 x10E3/uL  Lipid Panel w/o Chol/HDL Ratio  Result Value Ref Range   Cholesterol, Total 177 100 - 199 mg/dL   Triglycerides 123 0 - 149 mg/dL   HDL 61 >39 mg/dL   VLDL Cholesterol Cal 22 5 - 40 mg/dL   LDL Chol Calc (NIH) 94 0 - 99 mg/dL  PSA  Result Value Ref Range   Prostate Specific Ag, Serum 0.7 0.0 - 4.0 ng/mL  TSH  Result Value Ref Range   TSH 3.530 0.450 - 4.500 uIU/mL  Urinalysis, Routine w reflex microscopic  Result Value Ref Range   Specific Gravity, UA 1.015 1.005 - 1.030   pH, UA 7.0 5.0 - 7.5   Color, UA Yellow Yellow   Appearance Ur Clear Clear   Leukocytes,UA Negative Negative   Protein,UA Negative Negative/Trace   Glucose, UA Negative Negative   Ketones, UA Negative Negative   RBC, UA Negative Negative   Bilirubin, UA Negative Negative   Urobilinogen, Ur 0.2 0.2 - 1.0 mg/dL   Nitrite, UA Negative Negative  Microalbumin, Urine Waived  Result Value Ref Range   Microalb, Ur Waived 10 0 - 19 mg/L   Creatinine, Urine Waived 100 10 - 300 mg/dL   Microalb/Creat Ratio <30 <30 mg/g  Bayer DCA Hb A1c Waived  Result Value Ref Range   HB A1C (BAYER DCA - WAIVED) 5.3 <7.0 %  Hepatitis C Antibody  Result Value Ref Range   Hep C Virus Ab <0.1 0.0 - 0.9 s/co ratio  HIV Antibody (routine testing w rflx)  Result Value Ref Range    HIV Screen 4th Generation wRfx Non Reactive Non Reactive      Assessment & Plan:   Problem List Items Addressed This Visit       Respiratory   COPD (chronic obstructive pulmonary disease) (HCC)    Chronic, ongoing with good control on current inhaler regimen.  Very active and continues to surf daily.  Refills  on Albuterol sent and he will be calling this week to schedule a follow-up with his PCP.      Relevant Medications   albuterol (VENTOLIN HFA) 108 (90 Base) MCG/ACT inhaler    I discussed the assessment and treatment plan with the patient. The patient was provided an opportunity to ask questions and all were answered. The patient agreed with the plan and demonstrated an understanding of the instructions.   The patient was advised to call back or seek an in-person evaluation if the symptoms worsen or if the condition fails to improve as anticipated.   I provided 21+ minutes of time during this encounter.   Follow up plan: Return in about 3 months (around 11/03/2021) for Follow-up with Dr. Wynetta Emery.

## 2021-08-05 NOTE — Assessment & Plan Note (Signed)
Chronic, ongoing with good control on current inhaler regimen.  Very active and continues to surf daily.  Refills on Albuterol sent and he will be calling this week to schedule a follow-up with his PCP.

## 2021-08-05 NOTE — Telephone Encounter (Signed)
Requested medications are due for refill today.  Provider to review  Requested medications are on the active medications list.  yes  Last refill. 07/20/2021  Future visit scheduled.   no  Notes to clinic.  Pt is using more than 1 inhaler per month. Please review.    Requested Prescriptions  Pending Prescriptions Disp Refills   albuterol (VENTOLIN HFA) 108 (90 Base) MCG/ACT inhaler [Pharmacy Med Name: ALBUTEROL HFA INH(200 PUFFS)18GM] 18 g     Sig: INHALE 2 PUFFS BY MOUTH EVERY 6 HOURS AS NEEDED FOR WHEEZING OR SHORTNESS OF BREATH     Pulmonology:  Beta Agonists Failed - 08/04/2021  5:41 PM      Failed - One inhaler should last at least one month. If the patient is requesting refills earlier, contact the patient to check for uncontrolled symptoms.      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

## 2021-08-05 NOTE — Telephone Encounter (Signed)
Lmom asking pt to call back to schedule an appt. °

## 2021-09-07 ENCOUNTER — Other Ambulatory Visit: Payer: Self-pay | Admitting: Family Medicine

## 2021-09-08 ENCOUNTER — Telehealth: Payer: Self-pay | Admitting: *Deleted

## 2021-09-08 NOTE — Telephone Encounter (Signed)
Left message for pt asking if he still wants his inhaler rx to be at a [place] pharm or if he needs new rx here.

## 2021-09-09 NOTE — Telephone Encounter (Signed)
[facility] called and spoke to [place], Pensions consultant about the refill(s) Abuterol inhaler requested. Advised it was sent on 08/05/21 #18 g/4 refill(s). She says the patient picked up on 08/27/21 and he would have to pay out of pocket if he needs it sooner than a month. I called the patient and advised. He says he may be using it more than he should because of where he works and he always pay out of pocket. I advised to follow the directions and use as instructed 2 puffs every 6 hours as needed. He denies SOB, says he's using it as a precaution. He says he will start writing down when he uses it so he can keep up. I advised to call [facility] to let them know to refill because he has refills available, he verbalized understanding.

## 2021-09-09 NOTE — Telephone Encounter (Signed)
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 Please send to [facility] !

## 2021-09-09 NOTE — Telephone Encounter (Signed)
Patient called back stated that he need his Rx today and need it sent to this pharmacy please advise  [facility] [place], [place] Phone:  [phone]  Fax:  [phone]

## 2021-09-09 NOTE — Telephone Encounter (Signed)
Attempted to contact pt twice to inquire if the pharmacy in [place] is where he wants this prescription or would he like it transferred.

## 2021-09-09 NOTE — Telephone Encounter (Signed)
Duplicate Request Requested Prescriptions  Pending Prescriptions Disp Refills   albuterol (VENTOLIN HFA) 108 (90 Base) MCG/ACT inhaler [Pharmacy Med Name: ALBUTEROL HFA INH (200 PUFFS)8.5GM] 8.5 g     Sig: INHALE 2 PUFFS INTO THE LUNGS EVERY 6 HOURS AS NEEDED FOR WHEEZING OR SHORTNESS OF BREATH     Pulmonology:  Beta Agonists 2 Failed - 09/07/2021  4:31 PM      Failed - Last BP in normal range    BP Readings from Last 1 Encounters:  10/11/20 (!) 146/78         Passed - Last Heart Rate in normal range    Pulse Readings from Last 1 Encounters:  10/11/20 (!) 109         Passed - Valid encounter within last 12 months    Recent Outpatient Visits          1 month ago Simple chronic bronchitis (HCC)   [facility] [place], [place] T, NP   11 months ago Welcome to [employer] preventive visit   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

## 2021-10-13 ENCOUNTER — Other Ambulatory Visit: Payer: Self-pay | Admitting: Nurse Practitioner

## 2021-10-13 NOTE — Telephone Encounter (Signed)
Pt called in to check on the status of this Rx refill request, please advise.  ?

## 2021-10-14 ENCOUNTER — Ambulatory Visit (INDEPENDENT_AMBULATORY_CARE_PROVIDER_SITE_OTHER): Payer: Medicare Other | Admitting: *Deleted

## 2021-10-14 DIAGNOSIS — Z0000 Encounter for general adult medical examination without abnormal findings: Secondary | ICD-10-CM

## 2021-10-14 NOTE — Telephone Encounter (Signed)
Requested Prescriptions  ?Pending Prescriptions Disp Refills  ?? albuterol (VENTOLIN HFA) 108 (90 Base) MCG/ACT inhaler [Pharmacy Med Name: ALBUTEROL HFA INH (200 PUFFS)8.5GM] 8.5 g 6  ?  Sig: INHALE 2 PUFFS BY MOUTH INTO THE LUNGS EVERY 6 HOURS AS NEEDED FOR WHEEZING OR SHORTNESS OF BREATH  ?  ? Pulmonology:  Beta Agonists 2 Failed - 10/14/2021  1:43 PM  ?  ?  Failed - Last BP in normal range  ?  BP Readings from Last 1 Encounters:  ?10/11/20 (!) 146/78  ?   ?  ?  Passed - Last Heart Rate in normal range  ?  Pulse Readings from Last 1 Encounters:  ?10/11/20 (!) 109  ?   ?  ?  Passed - Valid encounter within last 12 months  ?  Recent Outpatient Visits   ?      ? 2 months ago Simple chronic bronchitis (HCC)  ? [facility] [place], Corrie Dandy T, NP  ? 1 year ago Welcome to Medicare preventive visit  ? [facility] [place], [place] P, DO  ? 1 year ago Simple chronic bronchitis (HCC)  ? [facility] [place], [place] P, DO  ? 1 year ago Arterial thrombosis (HCC)  ? [facility] [place] M, DO  ? 1 year ago Closed displaced transverse fracture of shaft of left humerus with routine healing, subsequent encounter  ? [facility] [place], [place] P, DO  ?  ?  ? ?  ?  ?  ? ?

## 2021-10-14 NOTE — Telephone Encounter (Signed)
Pt called in to check on the status of this Rx refill request, pt is very upset requesting a call back today. ? ? ?Please advise.  ? ?
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
## 2021-10-14 NOTE — Patient Instructions (Signed)
Steve Chaney , ?Thank you for taking time to come for your Medicare Wellness Visit. I appreciate your ongoing commitment to your health goals. Please review the following plan we discussed and let me know if I can assist you in the future.  ? ?Screening recommendations/referrals: ?Colonoscopy: up to date ?Recommended yearly ophthalmology/optometry visit for glaucoma screening and checkup ?Recommended yearly dental visit for hygiene and checkup ? ?Vaccinations: ?Influenza vaccine: Education provided ?Pneumococcal vaccine: Education provided ?Tdap vaccine: up to date ?Shingles vaccine: Education provided   ? ?Advanced directives: Education provided ? ?Conditions/risks identified:  ? ? ? ?Preventive Care 15 Years and Older, Male ?Preventive care refers to lifestyle choices and visits with your health care provider that can promote health and wellness. ?What does preventive care include? ?A yearly physical exam. This is also called an annual well check. ?Dental exams once or twice a year. ?Routine eye exams. Ask your health care provider how often you should have your eyes checked. ?Personal lifestyle choices, including: ?Daily care of your teeth and gums. ?Regular physical activity. ?Eating a healthy diet. ?Avoiding tobacco and drug use. ?Limiting alcohol use. ?Practicing safe sex. ?Taking low doses of aspirin every day. ?Taking vitamin and mineral supplements as recommended by your health care provider. ?What happens during an annual well check? ?The services and screenings done by your health care provider during your annual well check will depend on your age, overall health, lifestyle risk factors, and family history of disease. ?Counseling  ?Your health care provider may ask you questions about your: ?Alcohol use. ?Tobacco use. ?Drug use. ?Emotional well-being. ?Home and relationship well-being. ?Sexual activity. ?Eating habits. ?History of falls. ?Memory and ability to understand (cognition). ?Work and work  Statistician. ?Screening  ?You may have the following tests or measurements: ?Height, weight, and BMI. ?Blood pressure. ?Lipid and cholesterol levels. These may be checked every 5 years, or more frequently if you are over 14 years old. ?Skin check. ?Lung cancer screening. You may have this screening every year starting at age 60 if you have a 30-pack-year history of smoking and currently smoke or have quit within the past 15 years. ?Fecal occult blood test (FOBT) of the stool. You may have this test every year starting at age 55. ?Flexible sigmoidoscopy or colonoscopy. You may have a sigmoidoscopy every 5 years or a colonoscopy every 10 years starting at age 10. ?Prostate cancer screening. Recommendations will vary depending on your family history and other risks. ?Hepatitis C blood test. ?Hepatitis B blood test. ?Sexually transmitted disease (STD) testing. ?Diabetes screening. This is done by checking your blood sugar (glucose) after you have not eaten for a while (fasting). You may have this done every 1-3 years. ?Abdominal aortic aneurysm (AAA) screening. You may need this if you are a current or former smoker. ?Osteoporosis. You may be screened starting at age 91 if you are at high risk. ?Talk with your health care provider about your test results, treatment options, and if necessary, the need for more tests. ?Vaccines  ?Your health care provider may recommend certain vaccines, such as: ?Influenza vaccine. This is recommended every year. ?Tetanus, diphtheria, and acellular pertussis (Tdap, Td) vaccine. You may need a Td booster every 10 years. ?Zoster vaccine. You may need this after age 73. ?Pneumococcal 13-valent conjugate (PCV13) vaccine. One dose is recommended after age 2. ?Pneumococcal polysaccharide (PPSV23) vaccine. One dose is recommended after age 81. ?Talk to your health care provider about which screenings and vaccines you need and how often you need  them. ?This information is not intended to replace  advice given to you by your health care provider. Make sure you discuss any questions you have with your health care provider. ?Document Released: 07/26/2015 Document Revised: 03/18/2016 Document Reviewed: 04/30/2015 ?Elsevier Interactive Patient Education ? 2017 [place]. ? ?Fall Prevention in the Home ?Falls can cause injuries. They can happen to people of all ages. There are many things you can do to make your home safe and to help prevent falls. ?What can I do on the outside of my home? ?Regularly fix the edges of walkways and driveways and fix any cracks. ?Remove anything that might make you trip as you walk through a door, such as a raised step or threshold. ?Trim any bushes or trees on the path to your home. ?Use bright outdoor lighting. ?Clear any walking paths of anything that might make someone trip, such as rocks or tools. ?Regularly check to see if handrails are loose or broken. Make sure that both sides of any steps have handrails. ?Any raised decks and porches should have guardrails on the edges. ?Have any leaves, snow, or ice cleared regularly. ?Use sand or salt on walking paths during winter. ?Clean up any spills in your garage right away. This includes oil or grease spills. ?What can I do in the bathroom? ?Use night lights. ?Install grab bars by the toilet and in the tub and shower. Do not use towel bars as grab bars. ?Use non-skid mats or decals in the tub or shower. ?If you need to sit down in the shower, use a plastic, non-slip stool. ?Keep the floor dry. Clean up any water that spills on the floor as soon as it happens. ?Remove soap buildup in the tub or shower regularly. ?Attach bath mats securely with double-sided non-slip rug tape. ?Do not have throw rugs and other things on the floor that can make you trip. ?What can I do in the bedroom? ?Use night lights. ?Make sure that you have a light by your bed that is easy to reach. ?Do not use any sheets or blankets that are too big for your bed.  They should not hang down onto the floor. ?Have a firm chair that has side arms. You can use this for support while you get dressed. ?Do not have throw rugs and other things on the floor that can make you trip. ?What can I do in the kitchen? ?Clean up any spills right away. ?Avoid walking on wet floors. ?Keep items that you use a lot in easy-to-reach places. ?If you need to reach something above you, use a strong step stool that has a grab bar. ?Keep electrical cords out of the way. ?Do not use floor polish or wax that makes floors slippery. If you must use wax, use non-skid floor wax. ?Do not have throw rugs and other things on the floor that can make you trip. ?What can I do with my stairs? ?Do not leave any items on the stairs. ?Make sure that there are handrails on both sides of the stairs and use them. Fix handrails that are broken or loose. Make sure that handrails are as long as the stairways. ?Check any carpeting to make sure that it is firmly attached to the stairs. Fix any carpet that is loose or worn. ?Avoid having throw rugs at the top or bottom of the stairs. If you do have throw rugs, attach them to the floor with carpet tape. ?Make sure that you have a light switch at  the top of the stairs and the bottom of the stairs. If you do not have them, ask someone to add them for you. ?What else can I do to help prevent falls? ?Wear shoes that: ?Do not have high heels. ?Have rubber bottoms. ?Are comfortable and fit you well. ?Are closed at the toe. Do not wear sandals. ?If you use a stepladder: ?Make sure that it is fully opened. Do not climb a closed stepladder. ?Make sure that both sides of the stepladder are locked into place. ?Ask someone to hold it for you, if possible. ?Clearly mark and make sure that you can see: ?Any grab bars or handrails. ?First and last steps. ?Where the edge of each step is. ?Use tools that help you move around (mobility aids) if they are needed. These  include: ?Canes. ?Walkers. ?Scooters. ?Crutches. ?Turn on the lights when you go into a dark area. Replace any light bulbs as soon as they burn out. ?Set up your furniture so you have a clear path. Avoid moving your furniture around. ?

## 2021-10-17 ENCOUNTER — Other Ambulatory Visit: Payer: Self-pay | Admitting: Family Medicine

## 2021-10-17 NOTE — Telephone Encounter (Signed)
Requested medication (s) are due for refill today: yes  ?                 ?Requested medication (s) are on the active medication list: yes ? ?Last refill: 10/11/20 ? ?Future visit scheduled: no ? ?Notes to clinic:  Unable to refill per protocol due to failed labs. ? ? ?  ?Requested Prescriptions  ?Pending Prescriptions Disp Refills  ? XARELTO 20 MG TABS tablet [Pharmacy Med Name: XARELTO 20MG TABLETS] 90 tablet 3  ?  Sig: TAKE 1 TABLET(20 MG) BY MOUTH DAILY WITH SUPPER  ?  ? Hematology: Anticoagulants - rivaroxaban Failed - 10/17/2021  3:18 AM  ?  ?  Failed - ALT in normal range and within 360 days  ?  ALT  ?Date Value Ref Range Status  ?10/11/2020 16 0 - 44 IU/L Final  ?  ?  ?  ?  Failed - AST in normal range and within 360 days  ?  AST  ?Date Value Ref Range Status  ?10/11/2020 16 0 - 40 IU/L Final  ?  ?  ?  ?  Failed - Cr in normal range and within 360 days  ?  Creatinine, Ser  ?Date Value Ref Range Status  ?10/11/2020 0.76 0.76 - 1.27 mg/dL Final  ?  ?  ?  ?  Failed - HCT in normal range and within 360 days  ?  Hematocrit  ?Date Value Ref Range Status  ?10/11/2020 44.7 37.5 - 51.0 % Final  ?  ?  ?  ?  Failed - HGB in normal range and within 360 days  ?  Hemoglobin  ?Date Value Ref Range Status  ?10/11/2020 14.7 13.0 - 17.7 g/dL Final  ?  ?  ?  ?  Failed - PLT in normal range and within 360 days  ?  Platelets  ?Date Value Ref Range Status  ?10/11/2020 425 150 - 450 x10E3/uL Final  ?  ?  ?  ?  Failed - eGFR is 15 or above and within 360 days  ?  GFR calc Af Amer  ?Date Value Ref Range Status  ?08/21/2020 111 >59 mL/min/1.73 Final  ?  Comment:  ?  **In accordance with recommendations from the NKF-ASN Task force,** ?  [facility] is in the process of updating its eGFR calculation to the ?  2021 CKD-EPI creatinine equation that estimates kidney function ?  without a race variable. ?  ? ?GFR calc non Af Amer  ?Date Value Ref Range Status  ?08/21/2020 96 >59 mL/min/1.73 Final  ? ?eGFR  ?Date Value Ref Range Status  ?10/11/2020  100 >59 mL/min/1.73 Final  ?  ?  ?  ?  Passed - Patient is not pregnant  ?  ?  Passed - Valid encounter within last 12 months  ?  Recent Outpatient Visits   ? ?      ? 2 months ago Simple chronic bronchitis ([place])  ? [place], Henrine Screws T, NP  ? 1 year ago Welcome to Medicare preventive visit  ? [place], [place] P, DO  ? 1 year ago Simple chronic bronchitis ([place])  ? [place], DO  ? 1 year ago Arterial thrombosis ([place])  ? [place], [place], DO  ? 1 year ago Closed displaced transverse fracture of shaft of left humerus with routine healing, subsequent encounter  ? [place], [place] P, DO  ? ?  ?  ? ?  ?  ?  ? ? ?

## 2021-10-20 NOTE — Telephone Encounter (Signed)
Patient is overdue for appointment. Please call and schedule follow up.  ?

## 2021-10-20 NOTE — Telephone Encounter (Signed)
LVM asking pt to call back to schedule an appt for medication refill. ?

## 2021-10-22 NOTE — Telephone Encounter (Signed)
Called and spoke with patient and let him know that we are unable to fill his medication because its been over a year since we have seen him in office. Pt says he is in [place] but he has one more bottle of xarelto. Before the phone cut out (I tried to call him back and lvm). I let him know that for any future refills he will need an appointment. If patient calls back please get him scheduled for an appointment. ?

## 2021-11-10 ENCOUNTER — Other Ambulatory Visit: Payer: Self-pay | Admitting: Family Medicine

## 2021-11-10 ENCOUNTER — Encounter: Payer: Self-pay | Admitting: Family Medicine

## 2021-11-10 ENCOUNTER — Ambulatory Visit
Admission: RE | Admit: 2021-11-10 | Discharge: 2021-11-10 | Disposition: A | Discharge status: Home / Self Care | Payer: Medicare Other | Source: Ambulatory Visit | Attending: Family Medicine | Admitting: Family Medicine

## 2021-11-10 ENCOUNTER — Ambulatory Visit (INDEPENDENT_AMBULATORY_CARE_PROVIDER_SITE_OTHER): Payer: Medicare Other | Admitting: Family Medicine

## 2021-11-10 VITALS — BP 116/73 | HR 113 | Temp 98.6°F | Ht 74.02 in | Wt 124.8 lb

## 2021-11-10 DIAGNOSIS — R0602 Shortness of breath: Secondary | ICD-10-CM | POA: Insufficient documentation

## 2021-11-10 DIAGNOSIS — Z720 Tobacco use: Secondary | ICD-10-CM | POA: Diagnosis present

## 2021-11-10 DIAGNOSIS — R053 Chronic cough: Secondary | ICD-10-CM | POA: Diagnosis present

## 2021-11-10 DIAGNOSIS — I7410 Embolism and thrombosis of unspecified parts of aorta: Secondary | ICD-10-CM

## 2021-11-10 DIAGNOSIS — I10 Essential (primary) hypertension: Secondary | ICD-10-CM | POA: Diagnosis not present

## 2021-11-10 DIAGNOSIS — Z23 Encounter for immunization: Secondary | ICD-10-CM | POA: Diagnosis not present

## 2021-11-10 DIAGNOSIS — R000 Tachycardia, unspecified: Secondary | ICD-10-CM

## 2021-11-10 DIAGNOSIS — F3342 Major depressive disorder, recurrent, in full remission: Secondary | ICD-10-CM | POA: Diagnosis not present

## 2021-11-10 DIAGNOSIS — J410 Simple chronic bronchitis: Secondary | ICD-10-CM | POA: Diagnosis not present

## 2021-11-10 DIAGNOSIS — R634 Abnormal weight loss: Secondary | ICD-10-CM | POA: Insufficient documentation

## 2021-11-10 DIAGNOSIS — I700 Atherosclerosis of aorta: Secondary | ICD-10-CM

## 2021-11-10 DIAGNOSIS — R3911 Hesitancy of micturition: Secondary | ICD-10-CM

## 2021-11-10 DIAGNOSIS — Z136 Encounter for screening for cardiovascular disorders: Secondary | ICD-10-CM

## 2021-11-10 LAB — URINALYSIS, ROUTINE W REFLEX MICROSCOPIC
Bilirubin, UA: NEGATIVE
Glucose, UA: NEGATIVE
Ketones, UA: NEGATIVE
Leukocytes,UA: NEGATIVE
Nitrite, UA: NEGATIVE
Protein,UA: NEGATIVE
RBC, UA: NEGATIVE
Specific Gravity, UA: 1.015 (ref 1.005–1.030)
Urobilinogen, Ur: 1 mg/dL (ref 0.2–1.0)
pH, UA: 7 (ref 5.0–7.5)

## 2021-11-10 LAB — MICROALBUMIN, URINE WAIVED
Creatinine, Urine Waived: 200 mg/dL (ref 10–300)
Microalb, Ur Waived: 30 mg/L — ABNORMAL HIGH (ref 0–19)
Microalb/Creat Ratio: <30 mg/g (ref <30)

## 2021-11-10 MED ORDER — LISINOPRIL 5 MG PO TABS: 5.0000 mg | ORAL_TABLET | Freq: Every day | ORAL | 3 refills | Status: DC

## 2021-11-10 MED ORDER — MIRTAZAPINE 15 MG PO TABS: ORAL_TABLET | ORAL | 3 refills | Status: DC

## 2021-11-10 MED ORDER — FLUTICASONE-SALMETEROL 113-14 MCG/ACT IN AEPB: 1.0000 | INHALATION_SPRAY | Freq: Two times a day (BID) | RESPIRATORY_TRACT | 3 refills | Status: DC

## 2021-11-10 MED ORDER — TRELEGY ELLIPTA 100-62.5-25 MCG/ACT IN AEPB: 1.0000 | INHALATION_SPRAY | Freq: Every day | RESPIRATORY_TRACT | 11 refills | Status: AC

## 2021-11-10 MED ORDER — METOPROLOL SUCCINATE ER 25 MG PO TB24: 25.0000 mg | ORAL_TABLET | Freq: Every day | ORAL | 3 refills | Status: DC

## 2021-11-10 MED ORDER — RIVAROXABAN 20 MG PO TABS: ORAL_TABLET | ORAL | 3 refills | Status: DC

## 2021-11-10 NOTE — Progress Notes (Signed)
Interpreted by me today. Sinus tachycardia at 109bpm, no ST segment changes

## 2021-11-10 NOTE — Assessment & Plan Note (Signed)
Needs CT chest. Await results.  ?

## 2021-11-10 NOTE — Patient Instructions (Signed)
Head over now to the  ?[facility] [address]  ?[place], [place] ?For your CT scan of your chest ?

## 2021-11-10 NOTE — Assessment & Plan Note (Signed)
Stable, but losing weight. Will start him on remeron and recheck 1 month.  ?

## 2021-11-10 NOTE — Assessment & Plan Note (Signed)
Continue current regimen. Concern for symptoms. Checking CT chest today. Await results.  ?

## 2021-11-10 NOTE — Progress Notes (Signed)
? ?BP 116/73   Pulse (!) 113   Temp 98.6 ?F (37 ?C) (Oral)   Ht 6' 2.02" (1.88 m)   Wt 124 lb 12.8 oz (56.6 kg)   SpO2 99%   BMI 16.02 kg/m?   ? ?Subjective:  ? ? Patient ID: Steve Chaney, male    DOB: [birth year], 66 y.o.   MRN: [id number] ? ?HPI: ?Steve Chaney is a 66 y.o. male ? ?Chief Complaint  ?Patient presents with  ? COPD  ?  2 weeks ago has pneumonia, patient believes he has bronchitis   ? Hypertension  ? ?HYPERTENSION ?Hypertension status: controlled  ?Satisfied with current treatment? yes ?Duration of hypertension: chronic ?BP monitoring frequency:  not checking ?BP range:  ?BP medication side effects:  no ?Medication compliance: excellent compliance ?Previous BP meds: lisinopril ?Aspirin: no ?Recurrent headaches: no ?Visual changes: no ?Palpitations: no ?Dyspnea: yes ?Chest pain: no ?Lower extremity edema: no ?Dizzy/lightheaded: no ? ?COPD ?COPD status: exacerbated ?Satisfied with current treatment?: no ?Oxygen use: no ?Dyspnea frequency: daily  ?Cough frequency: several times a day ?Rescue inhaler frequency:  several times a week ?Limitation of activity: no ?Productive cough: no ?Pneumovax: Up to Date ?Influenza:  postpone to flu season ? ?Relevant past medical, surgical, family and social history reviewed and updated as indicated. Interim medical history since our last visit reviewed. ?Allergies and medications reviewed and updated. ? ?Review of Systems  ?Constitutional: Negative.   ?HENT: Negative.    ?Respiratory:  Positive for cough and shortness of breath. Negative for apnea, choking, chest tightness, wheezing and stridor.   ?Cardiovascular: Negative.   ?Gastrointestinal: Negative.   ?Musculoskeletal: Negative.   ?Neurological: Negative.   ?Psychiatric/Behavioral: Negative.    ? ?Per HPI unless specifically indicated above ? ?   ?Objective:  ?  ?BP 116/73   Pulse (!) 113   Temp 98.6 ?F (37 ?C) (Oral)   Ht 6' 2.02" (1.88 m)   Wt 124 lb 12.8 oz (56.6 kg)   SpO2 99%   BMI 16.02 kg/m?    ?Wt Readings from Last 3 Encounters:  ?11/10/21 124 lb 12.8 oz (56.6 kg)  ?10/11/20 126 lb (57.2 kg)  ?09/13/20 127 lb 11.2 oz (57.9 kg)  ?  ?Physical Exam ?Vitals and nursing note reviewed.  ?Constitutional:   ?   General: He is not in acute distress. ?   Appearance: Normal appearance. He is underweight. He is not ill-appearing, toxic-appearing or diaphoretic.  ?HENT:  ?   Head: Normocephalic and atraumatic.  ?   Right Ear: External ear normal.  ?   Left Ear: External ear normal.  ?   Nose: Nose normal.  ?   Mouth/Throat:  ?   Mouth: Mucous membranes are moist.  ?   Pharynx: Oropharynx is clear.  ?Eyes:  ?   General: No scleral icterus.    ?   Right eye: No discharge.     ?   Left eye: No discharge.  ?   Extraocular Movements: Extraocular movements intact.  ?   Conjunctiva/sclera: Conjunctivae normal.  ?   Pupils: Pupils are equal, round, and reactive to light.  ?Cardiovascular:  ?   Rate and Rhythm: Regular rhythm. Tachycardia present.  ?   Pulses: Normal pulses.  ?   Heart sounds: Normal heart sounds. No murmur heard. ?  No friction rub. No gallop.  ?Pulmonary:  ?   Effort: Pulmonary effort is normal. No respiratory distress.  ?   Breath sounds: Normal breath sounds. No stridor. No wheezing,  rhonchi or rales.  ?Chest:  ?   Chest wall: No tenderness.  ?Musculoskeletal:     ?   General: Normal range of motion.  ?   Cervical back: Normal range of motion and neck supple.  ?Skin: ?   General: Skin is warm and dry.  ?   Capillary Refill: Capillary refill takes less than 2 seconds.  ?   Coloration: Skin is not jaundiced or pale.  ?   Findings: No bruising, erythema, lesion or rash.  ?Neurological:  ?   General: No focal deficit present.  ?   Mental Status: He is alert and oriented to person, place, and time. Mental status is at baseline.  ?Psychiatric:     ?   Mood and Affect: Mood normal. Affect is tearful.     ?   Behavior: Behavior normal.     ?   Thought Content: Thought content normal.     ?   Judgment: Judgment  normal.  ? ? ?Results for orders placed or performed in visit on 10/11/20  ?Comprehensive metabolic panel  ?Result Value Ref Range  ? Glucose 94 65 - 99 mg/dL  ? BUN 5 (L) 8 - 27 mg/dL  ? Creatinine, Ser 0.76 0.76 - 1.27 mg/dL  ? eGFR 100 >59 mL/min/1.73  ? BUN/Creatinine Ratio 7 (L) 10 - 24  ? Sodium 139 134 - 144 mmol/L  ? Potassium 4.0 3.5 - 5.2 mmol/L  ? Chloride 101 96 - 106 mmol/L  ? CO2 22 20 - 29 mmol/L  ? Calcium 9.0 8.6 - 10.2 mg/dL  ? Total Protein 7.1 6.0 - 8.5 g/dL  ? Albumin 4.0 3.8 - 4.8 g/dL  ? Globulin, Total 3.1 1.5 - 4.5 g/dL  ? Albumin/Globulin Ratio 1.3 1.2 - 2.2  ? Bilirubin Total 0.4 0.0 - 1.2 mg/dL  ? Alkaline Phosphatase 120 44 - 121 IU/L  ? AST 16 0 - 40 IU/L  ? ALT 16 0 - 44 IU/L  ?CBC with Differential/Platelet  ?Result Value Ref Range  ? WBC 6.2 3.4 - 10.8 x10E3/uL  ? RBC 5.10 4.14 - 5.80 x10E6/uL  ? Hemoglobin 14.7 13.0 - 17.7 g/dL  ? Hematocrit 44.7 37.5 - 51.0 %  ? MCV 88 79 - 97 fL  ? MCH 28.8 26.6 - 33.0 pg  ? MCHC 32.9 31.5 - 35.7 g/dL  ? RDW 16.1 (H) 11.6 - 15.4 %  ? Platelets 425 150 - 450 x10E3/uL  ? Neutrophils 70 Not Estab. %  ? Lymphs 14 Not Estab. %  ? Monocytes 12 Not Estab. %  ? Eos 3 Not Estab. %  ? Basos 1 Not Estab. %  ? Neutrophils Absolute 4.3 1.4 - 7.0 x10E3/uL  ? Lymphocytes Absolute 0.9 0.7 - 3.1 x10E3/uL  ? Monocytes Absolute 0.8 0.1 - 0.9 x10E3/uL  ? EOS (ABSOLUTE) 0.2 0.0 - 0.4 x10E3/uL  ? Basophils Absolute 0.1 0.0 - 0.2 x10E3/uL  ? Immature Granulocytes 0 Not Estab. %  ? Immature Grans (Abs) 0.0 0.0 - 0.1 x10E3/uL  ?Lipid Panel w/o Chol/HDL Ratio  ?Result Value Ref Range  ? Cholesterol, Total 177 100 - 199 mg/dL  ? Triglycerides 123 0 - 149 mg/dL  ? HDL 61 >39 mg/dL  ? VLDL Cholesterol Cal 22 5 - 40 mg/dL  ? LDL Chol Calc (NIH) 94 0 - 99 mg/dL  ?PSA  ?Result Value Ref Range  ? Prostate Specific Ag, Serum 0.7 0.0 - 4.0 ng/mL  ?TSH  ?Result Value Ref Range  ?  TSH 3.530 0.450 - 4.500 uIU/mL  ?Urinalysis, Routine w reflex microscopic  ?Result Value Ref Range  ?  Specific Gravity, UA 1.015 1.005 - 1.030  ? pH, UA 7.0 5.0 - 7.5  ? Color, UA Yellow Yellow  ? Appearance Ur Clear Clear  ? Leukocytes,UA Negative Negative  ? Protein,UA Negative Negative/Trace  ? Glucose, UA Negative Negative  ? Ketones, UA Negative Negative  ? RBC, UA Negative Negative  ? Bilirubin, UA Negative Negative  ? Urobilinogen, Ur 0.2 0.2 - 1.0 mg/dL  ? Nitrite, UA Negative Negative  ?Microalbumin, Urine Waived  ?Result Value Ref Range  ? Microalb, Ur Waived 10 0 - 19 mg/L  ? Creatinine, Urine Waived 100 10 - 300 mg/dL  ? Microalb/Creat Ratio <30 <30 mg/g  ?Bayer DCA Hb A1c Waived  ?Result Value Ref Range  ? HB A1C (BAYER DCA - WAIVED) 5.3 <7.0 %  ?Hepatitis C Antibody  ?Result Value Ref Range  ? Hep C Virus Ab <0.1 0.0 - 0.9 s/co ratio  ?HIV Antibody (routine testing w rflx)  ?Result Value Ref Range  ? HIV Screen 4th Generation wRfx Non Reactive Non Reactive  ? ?   ?Assessment & Plan:  ? ?Problem List Items Addressed This Visit   ? ?  ? Cardiovascular and Mediastinum  ? Aortic embolism or thrombosis ([place])  ?  Continue xarelto. Labs drawn today. Await results. Call with any concerns.  ? ?  ?  ? Relevant Medications  ? lisinopril (ZESTRIL) 5 MG tablet  ? metoprolol succinate (TOPROL-XL) 25 MG 24 hr tablet  ? rivaroxaban (XARELTO) 20 MG TABS tablet  ? Other Relevant Orders  ? CBC with Differential/Platelet  ? Primary hypertension - Primary  ?  Under good control, but tachycardic. Will change his lisinopril to 93m and add metoprolol. Recheck 1 month. Call with any concerns.  ? ?  ?  ? Relevant Medications  ? lisinopril (ZESTRIL) 5 MG tablet  ? metoprolol succinate (TOPROL-XL) 25 MG 24 hr tablet  ? rivaroxaban (XARELTO) 20 MG TABS tablet  ? Other Relevant Orders  ? Comprehensive metabolic panel  ? CBC with Differential/Platelet  ? TSH  ? Urinalysis, Routine w reflex microscopic  ? Microalbumin, Urine Waived  ?  ? Respiratory  ? COPD (chronic obstructive pulmonary disease) ([place]  ?  Continue current regimen.  Concern for symptoms. Checking CT chest today. Await results.  ? ?  ?  ? Relevant Medications  ? Fluticasone-Salmeterol 113-14 MCG/ACT AEPB  ? Other Relevant Orders  ? CBC with Differential/Platelet  ?  ? Other  ? Dep

## 2021-11-10 NOTE — Assessment & Plan Note (Signed)
Under good control, but tachycardic. Will change his lisinopril to 5mg  and add metoprolol. Recheck 1 month. Call with any concerns.  ?

## 2021-11-10 NOTE — Assessment & Plan Note (Signed)
Continue xarelto. Labs drawn today. Await results. Call with any concerns.  ?

## 2021-11-11 ENCOUNTER — Other Ambulatory Visit: Payer: Self-pay | Admitting: Family Medicine

## 2021-11-11 DIAGNOSIS — D649 Anemia, unspecified: Secondary | ICD-10-CM

## 2021-11-11 LAB — COMPREHENSIVE METABOLIC PANEL
ALT: 12 IU/L (ref 0–44)
AST: 18 IU/L (ref 0–40)
Albumin/Globulin Ratio: 1.6 (ref 1.2–2.2)
Albumin: 3.9 g/dL (ref 3.8–4.8)
Alkaline Phosphatase: 167 IU/L — ABNORMAL HIGH (ref 44–121)
BUN/Creatinine Ratio: 9 — ABNORMAL LOW (ref 10–24)
BUN: 6 mg/dL — ABNORMAL LOW (ref 8–27)
Bilirubin Total: 0.3 mg/dL (ref 0.0–1.2)
CO2: 22 mmol/L (ref 20–29)
Calcium: 8.5 mg/dL — ABNORMAL LOW (ref 8.6–10.2)
Chloride: 98 mmol/L (ref 96–106)
Creatinine, Ser: 0.7 mg/dL — ABNORMAL LOW (ref 0.76–1.27)
Globulin, Total: 2.4 g/dL (ref 1.5–4.5)
Glucose: 97 mg/dL (ref 70–99)
Potassium: 3.3 mmol/L — ABNORMAL LOW (ref 3.5–5.2)
Sodium: 134 mmol/L (ref 134–144)
Total Protein: 6.3 g/dL (ref 6.0–8.5)
eGFR: 102 mL/min/{1.73_m2} (ref >59)

## 2021-11-11 LAB — CBC WITH DIFFERENTIAL/PLATELET
Basophils Absolute: 0.1 10*3/uL (ref 0.0–0.2)
Basos: 1 %
EOS (ABSOLUTE): 0.1 10*3/uL (ref 0.0–0.4)
Eos: 1 %
Hematocrit: 31.9 % — ABNORMAL LOW (ref 37.5–51.0)
Hemoglobin: 9.9 g/dL — ABNORMAL LOW (ref 13.0–17.7)
Immature Grans (Abs): 0 10*3/uL (ref 0.0–0.1)
Immature Granulocytes: 0 %
Lymphocytes Absolute: 0.9 10*3/uL (ref 0.7–3.1)
Lymphs: 10 %
MCH: 24.6 pg — ABNORMAL LOW (ref 26.6–33.0)
MCHC: 31 g/dL — ABNORMAL LOW (ref 31.5–35.7)
MCV: 79 fL (ref 79–97)
Monocytes Absolute: 1.2 10*3/uL — ABNORMAL HIGH (ref 0.1–0.9)
Monocytes: 14 %
Neutrophils Absolute: 6.6 10*3/uL (ref 1.4–7.0)
Neutrophils: 74 %
Platelets: 451 10*3/uL — ABNORMAL HIGH (ref 150–450)
RBC: 4.03 x10E6/uL — ABNORMAL LOW (ref 4.14–5.80)
RDW: 16.3 % — ABNORMAL HIGH (ref 11.6–15.4)
WBC: 8.8 10*3/uL (ref 3.4–10.8)

## 2021-11-11 LAB — TSH: TSH: 3.41 u[IU]/mL (ref 0.450–4.500)

## 2021-11-11 LAB — LIPID PANEL W/O CHOL/HDL RATIO
Cholesterol, Total: 118 mg/dL (ref 100–199)
HDL: 43 mg/dL (ref >39)
LDL Chol Calc (NIH): 58 mg/dL (ref 0–99)
Triglycerides: 89 mg/dL (ref 0–149)
VLDL Cholesterol Cal: 17 mg/dL (ref 5–40)

## 2021-11-11 LAB — PSA: Prostate Specific Ag, Serum: 0.7 ng/mL (ref 0.0–4.0)

## 2021-11-13 ENCOUNTER — Telehealth: Payer: Self-pay

## 2021-11-13 ENCOUNTER — Telehealth: Payer: Self-pay | Admitting: Family Medicine

## 2021-11-13 NOTE — Telephone Encounter (Signed)
-----   Message from Dorcas Carrow, DO sent at 11/10/2021  1:33 PM EDT ----- ?Curt Jews, I think Jwan needs help re-upping his xarelto paperwork. Thanks! ? ?

## 2021-11-13 NOTE — Telephone Encounter (Signed)
Copied from [place] [phone]. Topic: General - Other ?>> Nov 13, 2021  1:57 PM Leward Quan A wrote: ?Reason for CRM: Patient called in to inform Dr Wynetta Emery that he is back in [facility] and could not come back to the office when called yesterday afternoon. Asking for a call back to discuss medications that were given to him for the Anemia which he stated he was not aware of. Please call patient at Ph# [phone] ?

## 2021-11-14 ENCOUNTER — Ambulatory Visit: Payer: Self-pay | Admitting: *Deleted

## 2021-11-14 NOTE — Telephone Encounter (Signed)
?  Chief Complaint: Pt says he has pneumonia and needs Dr. Laural Benes to call in antibiotic for him.   He says he was seen at [facility] in [place] and told he has pneumonia but wasn't given antibiotics one day last week.  ?Symptoms: Has pneumonia ?Frequency:  ?Pertinent Negatives: Patient denies  ?Disposition: [] ED /[] Urgent Care (no appt availability in office) / [] Appointment(In office/virtual)/ []  [facility]/ [] Home Care/ [] Refused Recommended Disposition /[]  Mobile Bus/ [x]  Follow-up with PCP ?Additional Notes: Sent message to Dr.  ?

## 2021-11-14 NOTE — Telephone Encounter (Signed)
See other telephone encounter.

## 2021-11-14 NOTE — Telephone Encounter (Signed)
I saw Dr. Wynetta Emery on Monday.   I live in [place].  I just love Dr. Wynetta Emery.   A nurse called me and said I was anemic.   I had walking pneumonia too.   Did she send an antibiotic?   And something for the anemia? ? ? ?Reason for Disposition ? [1] Caller has URGENT medicine question about med that PCP or specialist prescribed AND [2] triager unable to answer question ? ?Answer Assessment - Initial Assessment Questions ?1. NAME of MEDICATION: "What medicine are you calling about?" ?    Dr. Wynetta Emery called in some medications for me.   I don't know what they are.   I live in [place].   I drive in to see her. ?2. QUESTION: "What is your question?" (e.g., double dose of medicine, side effect) ?    Did Dr. Wynetta Emery call in the prescription.   ?3. PRESCRIBING HCP: "Who prescribed it?" Reason: if prescribed by specialist, call should be referred to that group. ?    Dr. Wynetta Emery ?4. SYMPTOMS: "Do you have any symptoms?" ?    *No Answer* ?5. SEVERITY: If symptoms are present, ask "Are they mild, moderate or severe?" ?    *No Answer* ?6. PREGNANCY:  "Is there any chance that you are pregnant?" "When was your last menstrual period?" ?    *No Answer* ? ?Protocols used: Medication Question Call-A-AH ? ?

## 2021-11-14 NOTE — Telephone Encounter (Signed)
Returned patient call to let him know medication was not sent in for anemia. Advised him Dr. Laural Benes put in lab orders for him to check to see why he is anemic. Patient states he is in [facility] and will not be back until June 1. Patient states he had lots of blood drawn lately and recently got bit by a dog so he thinks that's why he's anemic. Patient got frustrated over the phone after I told him he could find a [facility] in [place] and would could fax over labs, he said he doesn't have time and hung up. Was not able to discuss antibiotic request per previous telephone call.  ?

## 2021-11-27 NOTE — Progress Notes (Signed)
I have attempted to reach out to the patient about the [place] option for the application process. I have left a voicemail for the patient to return phone call.  Corrie Mckusick, [place]

## 2021-12-11 ENCOUNTER — Ambulatory Visit: Payer: Medicare Other | Admitting: Family Medicine

## 2021-12-15 ENCOUNTER — Ambulatory Visit (INDEPENDENT_AMBULATORY_CARE_PROVIDER_SITE_OTHER): Payer: Medicare Other | Admitting: Nurse Practitioner

## 2021-12-15 ENCOUNTER — Encounter: Payer: Self-pay | Admitting: Nurse Practitioner

## 2021-12-15 VITALS — BP 136/81 | HR 95 | Temp 99.2°F | Wt 128.2 lb

## 2021-12-15 DIAGNOSIS — Z86711 Personal history of pulmonary embolism: Secondary | ICD-10-CM | POA: Diagnosis not present

## 2021-12-15 DIAGNOSIS — J410 Simple chronic bronchitis: Secondary | ICD-10-CM

## 2021-12-15 DIAGNOSIS — I7410 Embolism and thrombosis of unspecified parts of aorta: Secondary | ICD-10-CM

## 2021-12-15 NOTE — Progress Notes (Signed)
BP 136/81   Pulse 95   Temp 99.2 F (37.3 C) (Oral)   Wt 128 lb 3.2 oz (58.2 kg)   SpO2 96%   BMI 16.45 kg/m    Subjective:    Patient ID: Steve Chaney, male    DOB: [birth year], 66 y.o.   MRN: [id number]  HPI: Zac Torti is a 66 y.o. male  Chief Complaint  Patient presents with   Follow-up    4 week follow up     COPD Patient states he is doing so well he is still surfing.   COPD status:controlled Satisfied with current treatment?: no Oxygen use: no Dyspnea frequency: daily  Cough frequency: sometimes Rescue inhaler frequency:  some days he doesn't use it at all.  Has decreased use from last visit. Limitation of activity: no Productive cough: no Pneumovax: Up to Date Influenza:  postpone to flu season  Relevant past medical, surgical, family and social history reviewed and updated as indicated. Interim medical history since our last visit reviewed. Allergies and medications reviewed and updated.  Review of Systems  Respiratory:  Negative for cough and shortness of breath.    Per HPI unless specifically indicated above     Objective:    BP 136/81   Pulse 95   Temp 99.2 F (37.3 C) (Oral)   Wt 128 lb 3.2 oz (58.2 kg)   SpO2 96%   BMI 16.45 kg/m   Wt Readings from Last 3 Encounters:  12/15/21 128 lb 3.2 oz (58.2 kg)  11/10/21 124 lb 12.8 oz (56.6 kg)  10/11/20 126 lb (57.2 kg)    Physical Exam Vitals and nursing note reviewed.  Constitutional:      General: He is not in acute distress.    Appearance: Normal appearance. He is underweight. He is not ill-appearing, toxic-appearing or diaphoretic.  HENT:     Head: Normocephalic and atraumatic.     Right Ear: External ear normal.     Left Ear: External ear normal.     Nose: Nose normal.     Mouth/Throat:     Mouth: Mucous membranes are moist.     Pharynx: Oropharynx is clear.  Eyes:     General: No scleral icterus.       Right eye: No discharge.        Left eye: No discharge.      Extraocular Movements: Extraocular movements intact.     Conjunctiva/sclera: Conjunctivae normal.     Pupils: Pupils are equal, round, and reactive to light.  Cardiovascular:     Rate and Rhythm: Regular rhythm. Tachycardia present.     Pulses: Normal pulses.     Heart sounds: Normal heart sounds. No murmur heard.   No friction rub. No gallop.  Pulmonary:     Effort: Pulmonary effort is normal. No respiratory distress.     Breath sounds: Normal breath sounds. No stridor. No wheezing, rhonchi or rales.  Chest:     Chest wall: No tenderness.  Musculoskeletal:        General: Normal range of motion.     Cervical back: Normal range of motion and neck supple.  Skin:    General: Skin is warm and dry.     Capillary Refill: Capillary refill takes less than 2 seconds.     Coloration: Skin is not jaundiced or pale.     Findings: No bruising, erythema, lesion or rash.  Neurological:     General: No focal deficit present.     Mental  Status: He is alert and oriented to person, place, and time. Mental status is at baseline.  Psychiatric:        Mood and Affect: Mood normal. Affect is tearful.        Behavior: Behavior normal.        Thought Content: Thought content normal.        Judgment: Judgment normal.    Results for orders placed or performed in visit on 11/10/21  Comprehensive metabolic panel  Result Value Ref Range   Glucose 97 70 - 99 mg/dL   BUN 6 (L) 8 - 27 mg/dL   Creatinine, Ser 0.70 (L) 0.76 - 1.27 mg/dL   eGFR 102 >59 mL/min/1.73   BUN/Creatinine Ratio 9 (L) 10 - 24   Sodium 134 134 - 144 mmol/L   Potassium 3.3 (L) 3.5 - 5.2 mmol/L   Chloride 98 96 - 106 mmol/L   CO2 22 20 - 29 mmol/L   Calcium 8.5 (L) 8.6 - 10.2 mg/dL   Total Protein 6.3 6.0 - 8.5 g/dL   Albumin 3.9 3.8 - 4.8 g/dL   Globulin, Total 2.4 1.5 - 4.5 g/dL   Albumin/Globulin Ratio 1.6 1.2 - 2.2   Bilirubin Total 0.3 0.0 - 1.2 mg/dL   Alkaline Phosphatase 167 (H) 44 - 121 IU/L   AST 18 0 - 40 IU/L   ALT  12 0 - 44 IU/L  CBC with Differential/Platelet  Result Value Ref Range   WBC 8.8 3.4 - 10.8 x10E3/uL   RBC 4.03 (L) 4.14 - 5.80 x10E6/uL   Hemoglobin 9.9 (L) 13.0 - 17.7 g/dL   Hematocrit 31.9 (L) 37.5 - 51.0 %   MCV 79 79 - 97 fL   MCH 24.6 (L) 26.6 - 33.0 pg   MCHC 31.0 (L) 31.5 - 35.7 g/dL   RDW 16.3 (H) 11.6 - 15.4 %   Platelets 451 (H) 150 - 450 x10E3/uL   Neutrophils 74 Not Estab. %   Lymphs 10 Not Estab. %   Monocytes 14 Not Estab. %   Eos 1 Not Estab. %   Basos 1 Not Estab. %   Neutrophils Absolute 6.6 1.4 - 7.0 x10E3/uL   Lymphocytes Absolute 0.9 0.7 - 3.1 x10E3/uL   Monocytes Absolute 1.2 (H) 0.1 - 0.9 x10E3/uL   EOS (ABSOLUTE) 0.1 0.0 - 0.4 x10E3/uL   Basophils Absolute 0.1 0.0 - 0.2 x10E3/uL   Immature Granulocytes 0 Not Estab. %   Immature Grans (Abs) 0.0 0.0 - 0.1 x10E3/uL  Lipid Panel w/o Chol/HDL Ratio  Result Value Ref Range   Cholesterol, Total 118 100 - 199 mg/dL   Triglycerides 89 0 - 149 mg/dL   HDL 43 >39 mg/dL   VLDL Cholesterol Cal 17 5 - 40 mg/dL   LDL Chol Calc (NIH) 58 0 - 99 mg/dL  PSA  Result Value Ref Range   Prostate Specific Ag, Serum 0.7 0.0 - 4.0 ng/mL  TSH  Result Value Ref Range   TSH 3.410 0.450 - 4.500 uIU/mL  Urinalysis, Routine w reflex microscopic  Result Value Ref Range   Specific Gravity, UA 1.015 1.005 - 1.030   pH, UA 7.0 5.0 - 7.5   Color, UA Yellow Yellow   Appearance Ur Clear Clear   Leukocytes,UA Negative Negative   Protein,UA Negative Negative/Trace   Glucose, UA Negative Negative   Ketones, UA Negative Negative   RBC, UA Negative Negative   Bilirubin, UA Negative Negative   Urobilinogen, Ur 1.0 0.2 - 1.0 mg/dL  Nitrite, UA Negative Negative  Microalbumin, Urine Waived  Result Value Ref Range   Microalb, Ur Waived 30 (H) 0 - 19 mg/L   Creatinine, Urine Waived 200 10 - 300 mg/dL   Microalb/Creat Ratio <30 <30 mg/g      Assessment & Plan:   Problem List Items Addressed This Visit       Respiratory   COPD  (chronic obstructive pulmonary disease) ([place]) - Primary    Chronic. Improved from last visit.  Has decreased use of Albuterol.  Uses it a couple of times per week.  Continue with Fluticasone.  Follow up in 5 months for reevaluation.          Other   History of pulmonary embolus (PE)    Chronic. On PAP for Xerelto.  Will work on paperwork and have PCP sign to continue medication.          Follow up plan: Return in about 5 months (around 05/17/2022) for HTN, HLD, DM2 FU.

## 2021-12-15 NOTE — Assessment & Plan Note (Addendum)
Chronic. Improved from last visit.  Has decreased use of Albuterol.  Uses it a couple of times per week.  Continue with Fluticasone.  Follow up in 5 months for reevaluation.

## 2021-12-15 NOTE — Assessment & Plan Note (Signed)
Chronic. On PAP for Xerelto.  Will work on paperwork and have PCP sign to continue medication.

## 2021-12-16 ENCOUNTER — Telehealth: Payer: Self-pay

## 2021-12-16 NOTE — Telephone Encounter (Signed)
Attempted to call patient to notify of patient assistance program paperwork being completed, there is two sections that need to be handled by patient. Advised him the paperwork is upfront to be picked up at patient's earliest convenience.  Left detailed message.

## 2021-12-23 NOTE — Telephone Encounter (Signed)
Pt has called back and after calling office sent teams message to Myriam as pt is not been back in contact re med. [phone]

## 2022-01-14 ENCOUNTER — Other Ambulatory Visit: Payer: Self-pay | Admitting: Nurse Practitioner

## 2022-01-14 NOTE — Telephone Encounter (Signed)
Requested Prescriptions  Pending Prescriptions Disp Refills  . albuterol (VENTOLIN HFA) 108 (90 Base) MCG/ACT inhaler [Pharmacy Med Name: ALBUTEROL HFA INH (200 PUFFS) 8.5GM] 8.5 g 2    Sig: INHALE 2 PUFFS BY MOUTH EVERY 6 HOURS AS NEEDED FOR WHEEZING OR SHORTNESS OF BREATH     Pulmonology:  [facility] 2 Passed - 01/14/2022  9:35 AM      Passed - Last BP in normal range    BP Readings from Last 1 Encounters:  12/15/21 136/81         Passed - Last Heart Rate in normal range    Pulse Readings from Last 1 Encounters:  12/15/21 95         Passed - Valid encounter within last 12 months    Recent Outpatient Visits          1 month ago Simple chronic bronchitis (HCC)   [facility] Larae Grooms, NP   2 months ago Primary hypertension   [facility] [place], Megan P, DO   5 months ago Simple chronic bronchitis (HCC)   [facility] Emma, Corrie Dandy T, NP   1 year ago [facility] [employer] preventive visit   [facility] [place], [place], DO   1 year ago Simple chronic bronchitis [facility])   [facility] [place], Oralia Rud, DO      Future Appointments            In 4 months Laural Benes, Oralia Rud, DO [employer] [facility], [facility]

## 2022-01-15 NOTE — Telephone Encounter (Signed)
Refused duplicate request for Albuterol 108 mcg/act inhaler because it went to [facility] yesterday 01/14/2022 at 4:53 PM 8.5 g, 2 refills.

## 2022-03-12 ENCOUNTER — Other Ambulatory Visit: Payer: Self-pay | Admitting: Nurse Practitioner

## 2022-03-13 ENCOUNTER — Telehealth: Payer: Self-pay | Admitting: *Deleted

## 2022-03-13 NOTE — Telephone Encounter (Signed)
Duplicate, pharm added refills to original rx. Requested Prescriptions  Pending Prescriptions Disp Refills  . albuterol (VENTOLIN HFA) 108 (90 Base) MCG/ACT inhaler [Pharmacy Med Name: ALBUTEROL HFA INH (200 PUFFS) 8.5GM] 8.5 g 2    Sig: INHALE 2 PUFFS BY MOUTH EVERY 6 HOURS AS NEEDED FOR WHEEZING OR SHORTNESS OF BREATH     Pulmonology:  [facility] 2 Passed - 03/12/2022  3:58 PM      Passed - Last BP in normal range    BP Readings from Last 1 Encounters:  12/15/21 136/81         Passed - Last Heart Rate in normal range    Pulse Readings from Last 1 Encounters:  12/15/21 95         Passed - Valid encounter within last 12 months    Recent Outpatient Visits          2 months ago Simple chronic bronchitis (HCC)   [facility] Larae Grooms, NP   4 months ago Primary hypertension   [facility] [place], Megan P, DO   7 months ago Simple chronic bronchitis (HCC)   [facility] [place], Corrie Dandy T, NP   1 year ago [facility] [employer] preventive visit   [facility] [place], [place], DO   1 year ago Simple chronic bronchitis [facility])   [facility] [place], Oralia Rud, DO      Future Appointments            In 2 months Laural Benes, Oralia Rud, DO [facility], [facility]

## 2022-03-13 NOTE — Telephone Encounter (Signed)
Pt called to complain that it had been 24 hrs since he requested refill for inhaler. Agent explained the 1-3 day period. Pt meets protocol, refills given. Has upcoming appt. Tried to see if pt using more than one a month. Pt is a angry state and since he has met protocol and has appt upcoming refills given without further questioning.

## 2022-03-27 ENCOUNTER — Other Ambulatory Visit: Payer: Self-pay | Admitting: Nurse Practitioner

## 2022-03-30 NOTE — Telephone Encounter (Signed)
Requested Prescriptions  Pending Prescriptions Disp Refills  . albuterol (VENTOLIN HFA) 108 (90 Base) MCG/ACT inhaler [Pharmacy Med Name: ALBUTEROL HFA INH (200 PUFFS) 8.5GM] 8.5 g 2    Sig: INHALE 2 PUFFS BY MOUTH EVERY 6 HOURS AS NEEDED FOR WHEEZING OR SHORTNESS OF BREATH     Pulmonology:  [facility] 2 Passed - 03/27/2022 10:43 PM      Passed - Last BP in normal range    BP Readings from Last 1 Encounters:  12/15/21 136/81         Passed - Last Heart Rate in normal range    Pulse Readings from Last 1 Encounters:  12/15/21 95         Passed - Valid encounter within last 12 months    Recent Outpatient Visits          3 months ago Simple chronic bronchitis ([place])   [place], NP   4 months ago Primary hypertension   [place], Megan P, DO   7 months ago Simple chronic bronchitis ([place])   [place], Henrine Screws T, NP   1 year ago [facility] [employer] preventive visit   [place], [place], DO   1 year ago Simple chronic bronchitis [facility])   [place], Barb Merino, DO      Future Appointments            In 1 month [place], Barb Merino, DO [employer], [facility]

## 2022-05-17 ENCOUNTER — Other Ambulatory Visit: Payer: Self-pay | Admitting: Family Medicine

## 2022-05-18 ENCOUNTER — Ambulatory Visit: Payer: Medicare Other | Admitting: Family Medicine

## 2022-05-26 ENCOUNTER — Other Ambulatory Visit: Payer: Self-pay | Admitting: Family Medicine

## 2022-05-26 NOTE — Telephone Encounter (Signed)
Requested by interface surescripts. Medication discontinued 11/10/21.  Requested Prescriptions  Refused Prescriptions Disp Refills   Fluticasone-Salmeterol 113-14 MCG/ACT AEPB [Pharmacy Med Name: FLUTICASONE/SALMETEROL 113/14MCG IN] 3 each 3    Sig: INHALE 1 PUFF BY MOUTH EVERY MORNING AND EVERY NIGHT AT BEDTIME AS DIRECTED     Pulmonology:  Combination Products Passed - 05/26/2022  3:18 AM      Passed - Valid encounter within last 12 months    Recent Outpatient Visits           5 months ago Simple chronic bronchitis (HCC)   [facility] Larae Grooms, NP   6 months ago Primary hypertension   [facility] [place], Megan P, DO   9 months ago Simple chronic bronchitis (HCC)   [facility] [place], Corrie Dandy T, NP   1 year ago [employer] to [employer] preventive visit   [facility] [place], [place], DO   1 year ago Simple chronic bronchitis [facility])   [facility] [place], Oralia Rud, DO       Future Appointments             In 2 weeks Laural Benes, Oralia Rud, DO [facility], [facility]

## 2022-06-15 ENCOUNTER — Ambulatory Visit: Payer: Medicare Other | Admitting: Family Medicine

## 2022-06-23 ENCOUNTER — Ambulatory Visit: Payer: Medicare Other | Admitting: Family Medicine

## 2022-06-27 ENCOUNTER — Other Ambulatory Visit: Payer: Self-pay | Admitting: Nurse Practitioner

## 2022-06-29 NOTE — Telephone Encounter (Signed)
Patient is calling to check on his refill albuterol (VENTOLIN HFA) 108 (90 Base) MCG/ACT inhaler . Advised patient that the pharmacy sent over the request on 06/27/22 and it takes 48-72hrs to refill. Patient states that he is about out and has maybe 10 puffs left.

## 2022-06-29 NOTE — Telephone Encounter (Signed)
Unable to refill per protocol, Rx request is too soon. Last refill 05/19/22 for 8.5 g and 2 refills. Will refuse.  Requested Prescriptions  Pending Prescriptions Disp Refills   albuterol (VENTOLIN HFA) 108 (90 Base) MCG/ACT inhaler [Pharmacy Med Name: ALBUTEROL HFA INH (200 PUFFS) 8.5GM] 8.5 g 2    Sig: INHALE 2 PUFFS BY MOUTH EVERY 6 HOURS AS NEEDED FOR WHEEZING OR SHORTNESS OF BREATH     Pulmonology:  Beta Agonists 2 Passed - 06/29/2022  2:03 PM      Passed - Last BP in normal range    BP Readings from Last 1 Encounters:  12/15/21 136/81         Passed - Last Heart Rate in normal range    Pulse Readings from Last 1 Encounters:  12/15/21 95         Passed - Valid encounter within last 12 months    Recent Outpatient Visits           6 months ago Simple chronic bronchitis (HCC)   [facility] Larae Grooms, NP   7 months ago Primary hypertension   [facility] [place], Megan P, DO   10 months ago Simple chronic bronchitis (HCC)   [facility] [place], [place] T, NP   1 year ago [facility] to [employer] preventive visit   [facility] [place], [place], DO   1 year ago Simple chronic bronchitis [facility])   [facility] [place], Oralia Rud, DO       Future Appointments             In 1 week Laural Benes, Oralia Rud, DO [facility], [facility]

## 2022-07-10 ENCOUNTER — Other Ambulatory Visit: Payer: Self-pay | Admitting: Family Medicine

## 2022-07-10 ENCOUNTER — Ambulatory Visit: Payer: Medicare Other | Admitting: Family Medicine

## 2022-07-11 NOTE — Telephone Encounter (Signed)
Unable to refill per protocol, Rx request is too soon. Last refill 05/19/22 for 8.5g and 2 refills. Will refuse duplicate request.  Requested Prescriptions  Pending Prescriptions Disp Refills   albuterol (VENTOLIN HFA) 108 (90 Base) MCG/ACT inhaler [Pharmacy Med Name: ALBUTEROL HFA INH (200 PUFFS) 8.5GM] 8.5 g 2    Sig: INHALE 2 PUFFS BY MOUTH EVERY 6 HOURS AS NEEDED FOR WHEEZING OR SHORTNESS OF BREATH     Pulmonology:  Beta Agonists 2 Passed - 07/10/2022  7:41 PM      Passed - Last BP in normal range    BP Readings from Last 1 Encounters:  12/15/21 136/81         Passed - Last Heart Rate in normal range    Pulse Readings from Last 1 Encounters:  12/15/21 95         Passed - Valid encounter within last 12 months    Recent Outpatient Visits           6 months ago Simple chronic bronchitis (HCC)   [facility] Larae Grooms, NP   8 months ago Primary hypertension   [facility] [place], Megan P, DO   11 months ago Simple chronic bronchitis (HCC)   [facility] [place], Corrie Dandy T, NP   1 year ago [facility] to [employer] preventive visit   [facility] [place], [place], DO   1 year ago Simple chronic bronchitis [facility])   [facility] [place], [place], DO

## 2022-07-14 MED ORDER — ALBUTEROL SULFATE HFA 108 (90 BASE) MCG/ACT IN AERS: INHALATION_SPRAY | RESPIRATORY_TRACT | 2 refills | Status: DC

## 2022-07-14 NOTE — Telephone Encounter (Signed)
Requested Prescriptions  Pending Prescriptions Disp Refills   albuterol (VENTOLIN HFA) 108 (90 Base) MCG/ACT inhaler 8.5 g 2    Sig: INHALE 2 PUFFS BY MOUTH EVERY 6 HOURS AS NEEDED FOR WHEEZING OR SHORTNESS OF BREATH     Pulmonology:  Beta Agonists 2 Passed - 07/14/2022  4:46 PM      Passed - Last BP in normal range    BP Readings from Last 1 Encounters:  12/15/21 136/81         Passed - Last Heart Rate in normal range    Pulse Readings from Last 1 Encounters:  12/15/21 95         Passed - Valid encounter within last 12 months    Recent Outpatient Visits           7 months ago Simple chronic bronchitis ([place])   [facility] Jon Billings, NP   8 months ago Primary hypertension   [employer], Megan P, DO   11 months ago Simple chronic bronchitis ([place])   [place], Henrine Screws T, NP   1 year ago Welcome to [employer] preventive visit   [place], [place], DO   1 year ago Simple chronic bronchitis ([place])   [place], [place], DO       Future Appointments             In 3 days [place], Megan P, DO [place], [facility]            Refused Prescriptions Disp Refills   albuterol (VENTOLIN HFA) 108 (90 Base) MCG/ACT inhaler [Pharmacy Med Name: ALBUTEROL HFA INH (200 PUFFS) 8.5GM] 8.5 g 2    Sig: INHALE 2 PUFFS BY MOUTH EVERY 6 HOURS AS NEEDED FOR WHEEZING OR SHORTNESS OF BREATH     Pulmonology:  Beta Agonists 2 Passed - 07/14/2022  4:46 PM      Passed - Last BP in normal range    BP Readings from Last 1 Encounters:  12/15/21 136/81         Passed - Last Heart Rate in normal range    Pulse Readings from Last 1 Encounters:  12/15/21 95         Passed - Valid encounter within last 12 months    Recent Outpatient Visits           7 months ago Simple chronic bronchitis ([place])   [facility] Jon Billings, NP   8 months ago Primary hypertension    [employer], Megan P, DO   11 months ago Simple chronic bronchitis ([place])   [place], [place] T, NP   1 year ago Welcome to [employer] preventive visit   [employer], [place], DO   1 year ago Simple chronic bronchitis [facility])   [place], Barb Merino, DO       Future Appointments             In 3 days Wynetta Emery, Barb Merino, DO [employer], [facility]

## 2022-07-14 NOTE — Addendum Note (Signed)
Addended by: Matilde Sprang on: 07/14/2022 04:46 PM   Modules accepted: Orders

## 2022-07-14 NOTE — Telephone Encounter (Signed)
Patient called, left VM to return the call to the office to let us know the pharmacy to send the refill to.

## 2022-07-14 NOTE — Telephone Encounter (Signed)
Pt has used all albuterol (VENTOLIN HFA) 108 (90 Base) MCG/ACT inhaler    refills that were sent to pharmacy on 11.7.23 according to the pharmacy / pt has 2 puffs left and needs new refill sent to pharmacy asap  Pt has scheduled virtual appt for 1.5.23

## 2022-07-14 NOTE — Telephone Encounter (Signed)
Pt returned called and would like refill to go  Ollie Felicity, [place]

## 2022-07-17 ENCOUNTER — Telehealth: Payer: Medicare Other | Admitting: Family Medicine

## 2022-07-30 ENCOUNTER — Telehealth: Payer: Medicare Other | Admitting: Family Medicine

## 2022-08-06 ENCOUNTER — Telehealth: Payer: Self-pay | Admitting: Family Medicine

## 2022-08-06 NOTE — Telephone Encounter (Signed)
Copied from [place] [phone]. Topic: General - Other >> Aug 06, 2022  3:57 PM [place] B wrote: Reason for CRM: Patient called asking for help in finding primary doctor in [place] since that's where he will be. Please call back., His phone is down and this is a restaurant # he gave me.[place] >> Aug 06, 2022  4:11 PM Penni Bombard wrote: Pt also asked for a refill on the Albuterol.  He says he can;t seem to get appt at a primary care there.

## 2022-08-07 NOTE — Telephone Encounter (Signed)
The patient called back checking on status of finding a primary care provider in [place]. He is running out of Albuterol. Please assist patient further using the [phone] number

## 2022-08-10 ENCOUNTER — Ambulatory Visit: Payer: Medicare Other | Admitting: Family Medicine

## 2022-08-11 ENCOUNTER — Telehealth (INDEPENDENT_AMBULATORY_CARE_PROVIDER_SITE_OTHER): Payer: Medicare Other | Admitting: Family Medicine

## 2022-08-11 ENCOUNTER — Encounter: Payer: Self-pay | Admitting: Family Medicine

## 2022-08-11 DIAGNOSIS — F3342 Major depressive disorder, recurrent, in full remission: Secondary | ICD-10-CM

## 2022-08-11 DIAGNOSIS — J410 Simple chronic bronchitis: Secondary | ICD-10-CM

## 2022-08-11 DIAGNOSIS — I700 Atherosclerosis of aorta: Secondary | ICD-10-CM

## 2022-08-11 DIAGNOSIS — I10 Essential (primary) hypertension: Secondary | ICD-10-CM

## 2022-08-11 DIAGNOSIS — I7410 Embolism and thrombosis of unspecified parts of aorta: Secondary | ICD-10-CM

## 2022-08-11 DIAGNOSIS — Z86711 Personal history of pulmonary embolism: Secondary | ICD-10-CM

## 2022-08-11 MED ORDER — ALBUTEROL SULFATE HFA 108 (90 BASE) MCG/ACT IN AERS: INHALATION_SPRAY | RESPIRATORY_TRACT | 2 refills | Status: AC

## 2022-08-11 MED ORDER — METOPROLOL SUCCINATE ER 25 MG PO TB24: 25.0000 mg | ORAL_TABLET | Freq: Every day | ORAL | 0 refills | Status: AC

## 2022-08-11 MED ORDER — MIRTAZAPINE 15 MG PO TABS: 15.0000 mg | ORAL_TABLET | Freq: Every day | ORAL | 0 refills | Status: AC

## 2022-08-11 MED ORDER — RIVAROXABAN 20 MG PO TABS: ORAL_TABLET | ORAL | 0 refills | Status: AC

## 2022-08-11 MED ORDER — LISINOPRIL 5 MG PO TABS: 5.0000 mg | ORAL_TABLET | Freq: Every day | ORAL | 0 refills | Status: AC

## 2022-08-11 NOTE — Progress Notes (Signed)
There were no vitals taken for this visit.   Subjective:    Patient ID: Steve Chaney, male    DOB: [birth year], 67 y.o.   MRN: [id number]  HPI: Steve Chaney is a 67 y.o. male  Chief Complaint  Patient presents with   Hypertension   Hyperlipidemia   Was in Hospital for Covid pneumonia about a month ago and was hospitalized at that time. He is feeling back to normal, but was pretty sick for a while  HYPERTENSION / HYPERLIPIDEMIA Satisfied with current treatment? yes Duration of hypertension: chronic BP monitoring frequency: not checking BP medication side effects: no Past BP meds: lisinopril, metoprolol Duration of hyperlipidemia: chronic Cholesterol medication side effects: N/A Cholesterol supplements: none Past cholesterol medications: none Medication compliance: good compliance Aspirin: no Recent stressors: no Recurrent headaches: no Visual changes: no Palpitations: no Dyspnea: no Chest pain: no Lower extremity edema: no Dizzy/lightheaded: no  DEPRESSION Mood status: stable Satisfied with current treatment?: yes Symptom severity: mild  Duration of current treatment : months Side effects: no Medication compliance: excellent compliance Psychotherapy/counseling: no  Previous psychiatric medications: remeron Depressed mood: yes Anxious mood: no Anhedonia: no Significant weight loss or gain: no Insomnia: no  Fatigue: yes Feelings of worthlessness or guilt: no Impaired concentration/indecisiveness: no Suicidal ideations: no Hopelessness: no Crying spells: no    12/15/2021    3:13 PM 11/10/2021    1:47 PM 10/14/2021    8:42 AM 10/11/2020    9:25 AM 08/21/2020   10:58 AM  Depression screen PHQ 2/9  Decreased Interest 0 0 0 0 0  Down, Depressed, Hopeless 0 0 0 0 1  PHQ - 2 Score 0 0 0 0 1  Altered sleeping 0 0   1  Tired, decreased energy 0 0   0  Change in appetite 0 0   0  Feeling bad or failure about yourself  0 0   0  Trouble concentrating 0 0   0   Moving slowly or fidgety/restless 0 0   0  Suicidal thoughts 0 0   0  PHQ-9 Score 0 0   2  Difficult doing work/chores Not difficult at all Not difficult at all   Not difficult at all     Relevant past medical, surgical, family and social history reviewed and updated as indicated. Interim medical history since our last visit reviewed. Allergies and medications reviewed and updated.  Review of Systems  Constitutional: Negative.   Respiratory: Negative.    Cardiovascular: Negative.   Gastrointestinal: Negative.   Musculoskeletal: Negative.   Psychiatric/Behavioral: Negative.      Per HPI unless specifically indicated above     Objective:    There were no vitals taken for this visit.  Wt Readings from Last 3 Encounters:  12/15/21 128 lb 3.2 oz (58.2 kg)  11/10/21 124 lb 12.8 oz (56.6 kg)  10/11/20 126 lb (57.2 kg)    Physical Exam Vitals and nursing note reviewed.  Constitutional:      General: He is not in acute distress.    Appearance: Normal appearance. He is not ill-appearing, toxic-appearing or diaphoretic.  HENT:     Head: Normocephalic and atraumatic.     Right Ear: External ear normal.     Left Ear: External ear normal.     Nose: Nose normal.     Mouth/Throat:     Mouth: Mucous membranes are moist.     Pharynx: Oropharynx is clear.  Eyes:     General: No scleral  icterus.       Right eye: No discharge.        Left eye: No discharge.     Conjunctiva/sclera: Conjunctivae normal.     Pupils: Pupils are equal, round, and reactive to light.  Pulmonary:     Effort: Pulmonary effort is normal. No respiratory distress.     Comments: Speaking in full sentences Musculoskeletal:        General: Normal range of motion.     Cervical back: Normal range of motion.  Skin:    Coloration: Skin is not jaundiced or pale.     Findings: No bruising, erythema, lesion or rash.  Neurological:     Mental Status: He is alert and oriented to person, place, and time. Mental status  is at baseline.  Psychiatric:        Mood and Affect: Mood normal.        Behavior: Behavior normal.        Thought Content: Thought content normal.        Judgment: Judgment normal.     Results for orders placed or performed in visit on 11/10/21  Comprehensive metabolic panel  Result Value Ref Range   Glucose 97 70 - 99 mg/dL   BUN 6 (L) 8 - 27 mg/dL   Creatinine, Ser 0.70 (L) 0.76 - 1.27 mg/dL   eGFR 102 >59 mL/min/1.73   BUN/Creatinine Ratio 9 (L) 10 - 24   Sodium 134 134 - 144 mmol/L   Potassium 3.3 (L) 3.5 - 5.2 mmol/L   Chloride 98 96 - 106 mmol/L   CO2 22 20 - 29 mmol/L   Calcium 8.5 (L) 8.6 - 10.2 mg/dL   Total Protein 6.3 6.0 - 8.5 g/dL   Albumin 3.9 3.8 - 4.8 g/dL   Globulin, Total 2.4 1.5 - 4.5 g/dL   Albumin/Globulin Ratio 1.6 1.2 - 2.2   Bilirubin Total 0.3 0.0 - 1.2 mg/dL   Alkaline Phosphatase 167 (H) 44 - 121 IU/L   AST 18 0 - 40 IU/L   ALT 12 0 - 44 IU/L  CBC with Differential/Platelet  Result Value Ref Range   WBC 8.8 3.4 - 10.8 x10E3/uL   RBC 4.03 (L) 4.14 - 5.80 x10E6/uL   Hemoglobin 9.9 (L) 13.0 - 17.7 g/dL   Hematocrit 31.9 (L) 37.5 - 51.0 %   MCV 79 79 - 97 fL   MCH 24.6 (L) 26.6 - 33.0 pg   MCHC 31.0 (L) 31.5 - 35.7 g/dL   RDW 16.3 (H) 11.6 - 15.4 %   Platelets 451 (H) 150 - 450 x10E3/uL   Neutrophils 74 Not Estab. %   Lymphs 10 Not Estab. %   Monocytes 14 Not Estab. %   Eos 1 Not Estab. %   Basos 1 Not Estab. %   Neutrophils Absolute 6.6 1.4 - 7.0 x10E3/uL   Lymphocytes Absolute 0.9 0.7 - 3.1 x10E3/uL   Monocytes Absolute 1.2 (H) 0.1 - 0.9 x10E3/uL   EOS (ABSOLUTE) 0.1 0.0 - 0.4 x10E3/uL   Basophils Absolute 0.1 0.0 - 0.2 x10E3/uL   Immature Granulocytes 0 Not Estab. %   Immature Grans (Abs) 0.0 0.0 - 0.1 x10E3/uL  Lipid Panel w/o Chol/HDL Ratio  Result Value Ref Range   Cholesterol, Total 118 100 - 199 mg/dL   Triglycerides 89 0 - 149 mg/dL   HDL 43 >39 mg/dL   VLDL Cholesterol Cal 17 5 - 40 mg/dL   LDL Chol Calc (NIH) 58 0 - 99  mg/dL  PSA  Result Value Ref Range   Prostate Specific Ag, Serum 0.7 0.0 - 4.0 ng/mL  TSH  Result Value Ref Range   TSH 3.410 0.450 - 4.500 uIU/mL  Urinalysis, Routine w reflex microscopic  Result Value Ref Range   Specific Gravity, UA 1.015 1.005 - 1.030   pH, UA 7.0 5.0 - 7.5   Color, UA Yellow Yellow   Appearance Ur Clear Clear   Leukocytes,UA Negative Negative   Protein,UA Negative Negative/Trace   Glucose, UA Negative Negative   Ketones, UA Negative Negative   RBC, UA Negative Negative   Bilirubin, UA Negative Negative   Urobilinogen, Ur 1.0 0.2 - 1.0 mg/dL   Nitrite, UA Negative Negative  Microalbumin, Urine Waived  Result Value Ref Range   Microalb, Ur Waived 30 (H) 0 - 19 mg/L   Creatinine, Urine Waived 200 10 - 300 mg/dL   Microalb/Creat Ratio <30 <30 mg/g      Assessment & Plan:   Problem List Items Addressed This Visit       Cardiovascular and Mediastinum   Aortic embolism or thrombosis ([place])    Has not had blood work done in a while. Discussed the importance of regular blood work to ensure safety of medication. Has moved 5 hours away and travels back for visits. Stressed the importance of finding a PCP at home so he doesn't have to drive to [place] to be seen. He states that he cannot do this - we will get CCM involved to see about getting him hooked up with PCP in [place].       Relevant Medications   lisinopril (ZESTRIL) 5 MG tablet   metoprolol succinate (TOPROL-XL) 25 MG 24 hr tablet   rivaroxaban (XARELTO) 20 MG TABS tablet   Other Relevant Orders   AMB Referral to Chronic Care Management Services   Primary hypertension - Primary    Unable to get blood pressure. Unclear about efficacy of medication. Has not had blood work done in a while. Discussed the importance of regular blood work to ensure safety of medication. Has moved 5 hours away and travels back for visits. Stressed the importance of finding a PCP at home so he doesn't have to drive to [place] to be seen. He  states that he cannot do this - we will get CCM involved to see about getting him hooked up with PCP in [place]. Will give 90 days of medicine. Will need to be seen for BP check and blood work during that time to continue to receive any refills.       Relevant Medications   lisinopril (ZESTRIL) 5 MG tablet   metoprolol succinate (TOPROL-XL) 25 MG 24 hr tablet   rivaroxaban (XARELTO) 20 MG TABS tablet   Other Relevant Orders   AMB Referral to Chronic Care Management Services   Aortic atherosclerosis ([place])    Will keep BP and cholesterol under good control. Continue to monitor. Call with any concerns.       Relevant Medications   lisinopril (ZESTRIL) 5 MG tablet   metoprolol succinate (TOPROL-XL) 25 MG 24 hr tablet   rivaroxaban (XARELTO) 20 MG TABS tablet   Other Relevant Orders   AMB Referral to Chronic Care Management Services     Respiratory   COPD (chronic obstructive pulmonary disease) ([place])    Under good control on current regimen. Continue current regimen. Continue to monitor. Call with any concerns. Refills given.        Relevant Medications   albuterol (VENTOLIN  HFA) 108 (90 Base) MCG/ACT inhaler   Other Relevant Orders   AMB Referral to Chronic Care Management Services     Other   Depression    Doing OK at this time. Refills given. Continue to monitor.       Relevant Medications   mirtazapine (REMERON) 15 MG tablet   History of pulmonary embolus ([place])    Has not had blood work done in a while. Discussed the importance of regular blood work to ensure safety of medication. Has moved 5 hours away and travels back for visits. Stressed the importance of finding a PCP at home so he doesn't have to drive to [place] to be seen. He states that he cannot do this - we will get CCM involved to see about getting him hooked up with PCP in [place]. Will give 90 days of medicine. Will need to be seen for BP check and blood work during that time to continue to receive any refills.         Follow  up plan: Return when he's in  again.   This visit was completed via video visit through MyChart due to the restrictions of the COVID-19 pandemic. All issues as above were discussed and addressed. Physical exam was done as above through visual confirmation on video through MyChart. If it was felt that the patient should be evaluated in the office, they were directed there. The patient verbally consented to this visit. Location of the patient: home Location of the provider: work Those involved with this call:  Provider: Park Liter, DO CMA: Irena Reichmann, [place] Desk/Registration: [employer]  Time spent on call:  25 minutes with patient face to face via video conference. More than 50% of this time was spent in counseling and coordination of care. 40 minutes total spent in review of patient's record and preparation of their chart.

## 2022-08-11 NOTE — Telephone Encounter (Signed)
Patient has an appointment with provider today at 2:20 PM. Patient can discuss further at appointment.

## 2022-08-13 ENCOUNTER — Telehealth: Payer: Self-pay | Admitting: Family Medicine

## 2022-08-13 NOTE — Telephone Encounter (Signed)
Pt is calling to ask if Destiny can resubmit the application to [employer] page 2, 3. Fax Number [employer]  [phone]

## 2022-08-16 NOTE — Assessment & Plan Note (Signed)
Will keep BP and cholesterol under good control. Continue to monitor. Call with any concerns.

## 2022-08-16 NOTE — Assessment & Plan Note (Signed)
Has not had blood work done in a while. Discussed the importance of regular blood work to ensure safety of medication. Has moved 5 hours away and travels back for visits. Stressed the importance of finding a PCP at home so he doesn't have to drive to [place] to be seen. He states that he cannot do this - we will get CCM involved to see about getting him hooked up with PCP in [place]. Will give 90 days of medicine. Will need to be seen for BP check and blood work during that time to continue to receive any refills.

## 2022-08-16 NOTE — Assessment & Plan Note (Signed)
Doing OK at this time. Refills given. Continue to monitor.

## 2022-08-16 NOTE — Assessment & Plan Note (Signed)
Under good control on current regimen. Continue current regimen. Continue to monitor. Call with any concerns. Refills given.   

## 2022-08-16 NOTE — Assessment & Plan Note (Signed)
Unable to get blood pressure. Unclear about efficacy of medication. Has not had blood work done in a while. Discussed the importance of regular blood work to ensure safety of medication. Has moved 5 hours away and travels back for visits. Stressed the importance of finding a PCP at home so he doesn't have to drive to [place] to be seen. He states that he cannot do this - we will get CCM involved to see about getting him hooked up with PCP in [place]. Will give 90 days of medicine. Will need to be seen for BP check and blood work during that time to continue to receive any refills.

## 2022-08-16 NOTE — Assessment & Plan Note (Signed)
Has not had blood work done in a while. Discussed the importance of regular blood work to ensure safety of medication. Has moved 5 hours away and travels back for visits. Stressed the importance of finding a PCP at home so he doesn't have to drive to [place] to be seen. He states that he cannot do this - we will get CCM involved to see about getting him hooked up with PCP in [place].

## 2022-08-25 NOTE — Telephone Encounter (Signed)
Reached out to the contact information regarding the prior authorization for [place]. Was informed that the patient is due to renew his Wynetta Emery and Arts development officer. Patient was made aware of recommendations. "[phone] - Patient would need to be call to be re-approve. Kyzen Rainge ID: [id number]." Patient verbalized understanding and has no further questions.

## 2022-09-22 ENCOUNTER — Telehealth: Payer: Self-pay | Admitting: Family Medicine

## 2022-09-22 NOTE — Telephone Encounter (Signed)
Copied from [place] [phone]. Topic: Medicare AWV >> Sep 22, 2022  1:31 PM Devoria Glassing wrote: Reason for CRM: Called patient to schedule Medicare Annual Wellness Visit (AWV). Left message for patient to call back and schedule Medicare Annual Wellness Visit (AWV).  Last date of AWV: 10/14/2021   Please schedule an appointment at any time with Kirke Shaggy, LPN .  If any questions, please contact me.  Thank you ,  Sherol Dade; [place] Direct Dial: [phone]

## 2022-09-25 ENCOUNTER — Telehealth: Payer: Self-pay | Admitting: Family Medicine

## 2022-09-25 NOTE — Telephone Encounter (Signed)
Contacted Steve Chaney to schedule their annual wellness visit. Appointment made for 10/27/2022.  Sherol Dade; Care Guide [facility] Direct Dial: [phone]

## 2022-10-27 ENCOUNTER — Ambulatory Visit (INDEPENDENT_AMBULATORY_CARE_PROVIDER_SITE_OTHER): Payer: Medicare Other

## 2022-10-27 VITALS — Ht 74.0 in | Wt 128.0 lb

## 2022-10-27 DIAGNOSIS — Z1211 Encounter for screening for malignant neoplasm of colon: Secondary | ICD-10-CM

## 2022-10-27 DIAGNOSIS — Z0000 Encounter for general adult medical examination without abnormal findings: Secondary | ICD-10-CM | POA: Diagnosis not present
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
## 2022-10-27 NOTE — Addendum Note (Signed)
Addended by: Hal Hope on: 10/27/2022 10:12 AM   Modules accepted: Orders

## 2022-11-03 ENCOUNTER — Telehealth: Payer: Self-pay | Admitting: *Deleted

## 2022-11-03 NOTE — Patient Outreach (Signed)
  Care Coordination   11/03/2022 Name: Steve Chaney MRN: [id number] DOB: 03-Sep-1955   Care Coordination Outreach Attempts:  An unsuccessful telephone outreach was attempted today to offer the patient information about available care coordination services as a benefit of their health plan.   Follow Up Plan:  Additional outreach attempts will be made to offer the patient care coordination information and services.   Encounter Outcome:  No Answer   Care Coordination Interventions:  No, not indicated    Kemper Durie, RN, MSN, [facility] Care Management Care Management Coordinator [phone]

## 2022-11-09 ENCOUNTER — Telehealth: Payer: Self-pay | Admitting: *Deleted

## 2022-11-09 ENCOUNTER — Encounter: Payer: Self-pay | Admitting: *Deleted

## 2022-11-09 NOTE — Patient Outreach (Signed)
  Care Coordination   Initial Visit Note   11/10/2022 Name: Steve Chaney MRN: [id number] DOB: 1955/10/04  Steve Chaney is a 67 y.o. year old male who sees Dorcas Carrow, DO for primary care. I spoke with  Steve Chaney by phone today.  What matters to the patients health and wellness today?  Have patient assistance for Xarelto renewed.     Goals Addressed             This Visit's Progress    COMPLETED: Care Coordination Activities - No follow up needed       Care Coordination Interventions: Patient interviewed about adult health maintenance status including  Regular eye checkups Regular Dental Care    Blood Pressure    Advised patient to discuss  Colonoscopy    Pneumonia Vaccine Influenza Vaccine COVID vaccination    with primary care provider  SDOH assessment complete         SDOH assessments and interventions completed:  Yes  SDOH Interventions Today    Flowsheet Row Most Recent Value  SDOH Interventions   Food Insecurity Interventions Intervention Not Indicated  Housing Interventions Intervention Not Indicated  Transportation Interventions Intervention Not Indicated        Care Coordination Interventions:  Yes, provided   Interventions Today    Flowsheet Row Most Recent Value  Chronic Disease   Chronic disease during today's visit Atrial Fibrillation (AFib)  General Interventions   General Interventions Discussed/Reviewed Communication with, General Interventions Reviewed, Doctor Visits  Doctor Visits Discussed/Reviewed Doctor Visits Reviewed, Annual Wellness Visits, PCP  PCP/Specialist Visits Compliance with follow-up visit  [Has PCP in [place], but still sees Dr. Laural Benes when he is in the area.]  Communication with Pharmacists, PCP/Specialists  [PCP requested to send referral to pharmacy team for medication assistance]  Education Interventions   Education Provided Provided Education  Provided Verbal Education On Nutrition, When to see the doctor,  Medication  Pharmacy Interventions   Pharmacy Dicussed/Reviewed Affording Medications, Pharmacy Topics Reviewed, Medications and their functions, Referral to Pharmacist  [Xarelto patient assistance need to be updated]  Referral to Pharmacist Cannot afford medications        Follow up plan: Referral made to pharmacy team    Encounter Outcome:  Pt. Visit Completed   Kemper Durie, RN, MSN, [facility] Care Management Care Management Coordinator [phone]

## 2022-11-10 ENCOUNTER — Encounter: Payer: Self-pay | Admitting: *Deleted

## 2022-11-12 ENCOUNTER — Telehealth: Payer: Self-pay | Admitting: *Deleted

## 2022-11-12 ENCOUNTER — Telehealth: Payer: Self-pay

## 2022-11-12 NOTE — Patient Outreach (Signed)
  Care Coordination   11/12/2022 Name: Steve Chaney MRN: [id number] DOB: 09/02/1955   Care Coordination Outreach Attempts:  Call placed to member to follow up on request for patient assistance for Xarelto, advised that per previous MD, new MD would have to submit paperwork.  Patient advised to call new PCP in [place] to request assistance.   Follow Up Plan:  No further outreach attempts will be made at this time. We have been unable to contact the patient to offer or enroll patient in care coordination services  Encounter Outcome:  No Answer   Care Coordination Interventions:  No, not indicated    Kemper Durie, RN, MSN, [facility] Care Management Care Management Coordinator [phone]

## 2022-11-12 NOTE — Telephone Encounter (Signed)
-----   Message from Dorcas Carrow, DO sent at 11/12/2022  9:38 AM EDT ----- Can we please find out who his new PCP is so we can get him reattributed?  ----- Message ----- From: Kemper Durie, RN Sent: 11/12/2022   8:58 AM EDT To: Dorcas Carrow, DO  He has established with a new provider in [place], I will tell him to reach out to them.  Thank you! Kemper Durie, RN, MSN, [facility] Care Management Care Management Coordinator [phone]  ----- Message ----- From: Dorcas Carrow, DO Sent: 11/10/2022   9:43 PM EDT To: Kemper Durie, RN  Patient has moved to [place]. He needs to establish with a new PCP in [place]. Referral to CCM was placed in January to help assist him with finding a new physician in [place].  ----- Message ----- From: Kemper Durie, RN Sent: 11/10/2022   5:06 PM EDT To: Dorcas Carrow, DO  Good afternoon Dr. Laural Benes Mr. Nakama said he need the patient assistance for his Xarelto renewed.  Harrold Donath said he is able to help but will need an order to open his case.  Would that be possible? Thanks, Kemper Durie, RN, MSN, [facility] Care Management Care Management Coordinator [phone]

## 2022-11-12 NOTE — Telephone Encounter (Signed)
Received a secure message from Kemper Durie, RN with Patient Engagement Center and was informed that the new PCP for the patient is Lamar Sprinkles, NP in [place].

## 2022-11-30 ENCOUNTER — Other Ambulatory Visit: Payer: Self-pay

## 2022-12-10 ENCOUNTER — Other Ambulatory Visit: Payer: Self-pay | Admitting: Family Medicine

## 2022-12-11 NOTE — Telephone Encounter (Signed)
Requested medications are due for refill today.  yes  Requested medications are on the active medications list.  yes  Last refill. 08/11/2022 8.5 2 rf  Future visit scheduled.   yes  Notes to clinic.  Kaitlin Peatross listed as pcp.    Requested Prescriptions  Pending Prescriptions Disp Refills   albuterol (VENTOLIN HFA) 108 (90 Base) MCG/ACT inhaler [Pharmacy Med Name: ALBUTEROL HFA INH (200 PUFFS) 8.5GM] 8.5 g 2    Sig: INHALE 2 PUFFS BY MOUTH EVERY 6 HOURS AS NEEDED FOR WHEEZING OR SHORTNESS OF BREATH     Pulmonology:  Beta Agonists 2 Passed - 12/10/2022  6:54 PM      Passed - Last BP in normal range    BP Readings from Last 1 Encounters:  12/15/21 136/81         Passed - Last Heart Rate in normal range    Pulse Readings from Last 1 Encounters:  12/15/21 95         Passed - Valid encounter within last 12 months    Recent Outpatient Visits           4 months ago Primary hypertension   [place] [facility] [place], Megan P, DO   12 months ago Simple chronic bronchitis [facility])   [place] [facility] Larae Grooms, NP   1 year ago Primary hypertension   [place] [facility] [place], Megan P, DO   1 year ago Simple chronic bronchitis (HCC)   [place] [facility] [place], Corrie Dandy T, NP   2 years ago [facility] [employer] preventive visit   [place] [facility] [place], [place], [place]

## 2022-12-13 ENCOUNTER — Other Ambulatory Visit: Payer: Self-pay | Admitting: Family Medicine

## 2022-12-14 NOTE — Telephone Encounter (Signed)
Fluticasone-Umeclidin-Vilant (TRELEGY ELLIPTA) 100-62.5-25 MCG/ACT AEPB 1 each 11 11/10/2021   Requested Prescriptions  Pending Prescriptions Disp Refills   Fluticasone-Salmeterol 113-14 MCG/ACT AEPB [Pharmacy Med Name: FLUTICASONE/SALMETEROL 113/14MCG IN] 3 each 3    Sig: INHALE 1 PUFF INTO THE LUNGS IN THE MORNING AND AT BEDTIME     Pulmonology:  Combination Products Passed - 12/13/2022  1:25 PM      Passed - Valid encounter within last 12 months    Recent Outpatient Visits           4 months ago Primary hypertension   [place] [facility] [place], Megan P, DO   12 months ago Simple chronic bronchitis [facility])   [place] [facility] Larae Grooms, NP   1 year ago Primary hypertension   [place] [facility] [place], Megan P, DO   1 year ago Simple chronic bronchitis (HCC)   [place] [facility] [place], Corrie Dandy T, NP   2 years ago [facility] [employer] preventive visit    [facility] [place], [place], [place]

## 2022-12-28 ENCOUNTER — Other Ambulatory Visit: Payer: Self-pay | Admitting: Family Medicine

## 2022-12-29 NOTE — Telephone Encounter (Signed)
Pt no longer under prescribers care  Requested Prescriptions  Refused Prescriptions Disp Refills   albuterol (VENTOLIN HFA) 108 (90 Base) MCG/ACT inhaler [Pharmacy Med Name: ALBUTEROL HFA INH (200 PUFFS) 8.5GM] 8.5 g 2    Sig: INHALE 2 PUFFS BY MOUTH EVERY 6 HOURS AS NEEDED FOR WHEEZING OR SHORTNESS OF BREATH     Pulmonology:  Beta Agonists 2 Passed - 12/28/2022  2:20 PM      Passed - Last BP in normal range    BP Readings from Last 1 Encounters:  12/15/21 136/81         Passed - Last Heart Rate in normal range    Pulse Readings from Last 1 Encounters:  12/15/21 95         Passed - Valid encounter within last 12 months    Recent Outpatient Visits           4 months ago Primary hypertension   [place] [facility] [place], [place] P, DO   1 year ago Simple chronic bronchitis [facility])   [place] [facility] Larae Grooms, NP   1 year ago Primary hypertension   [place] [facility] [place], Megan P, DO   1 year ago Simple chronic bronchitis (HCC)   [place] [facility] [place], Corrie Dandy T, NP   2 years ago [facility] [employer] preventive visit   [place] [facility] [place], [place], [place]

## 2023-01-13 ENCOUNTER — Other Ambulatory Visit: Payer: Self-pay | Admitting: Family Medicine

## 2023-01-13 NOTE — Telephone Encounter (Signed)
Requested medication (s) are due for refill today: yes  Requested medication (s) are on the active medication list: yes  Last refill:  08/11/22 #90/0  Future visit scheduled: no  Notes to clinic:  Unable to refill per protocol due to failed labs, no updated results.    Requested Prescriptions  Pending Prescriptions Disp Refills   lisinopril (ZESTRIL) 5 MG tablet [Pharmacy Med Name: LISINOPRIL 5MG  TABLETS] 90 tablet 0    Sig: TAKE 1 TABLET(5 MG) BY MOUTH DAILY     Cardiovascular:  ACE Inhibitors Failed - 01/13/2023  4:19 PM      Failed - Cr in normal range and within 180 days    Creatinine, Ser  Date Value Ref Range Status  11/10/2021 0.70 (L) 0.76 - 1.27 mg/dL Final         Failed - K in normal range and within 180 days    Potassium  Date Value Ref Range Status  11/10/2021 3.3 (L) 3.5 - 5.2 mmol/L Final         Passed - Patient is not pregnant      Passed - Last BP in normal range    BP Readings from Last 1 Encounters:  12/15/21 136/81         Passed - Valid encounter within last 6 months    Recent Outpatient Visits           5 months ago Primary hypertension   [place] [facility] [place], Megan P, DO   1 year ago Simple chronic bronchitis (HCC)   [place] [facility] Larae Grooms, NP   1 year ago Primary hypertension   [place] [facility] [place], Megan P, DO   1 year ago Simple chronic bronchitis (HCC)   [place] [facility] [place], Corrie Dandy T, NP   2 years ago [facility] to [employer] preventive visit   [place] [facility] [place], [place], [place]

## 2023-07-05 IMAGING — CT CT CHEST W/O CM
2 of 4 series · 15 of 36 positions shown, 18 images · non-contrast
Comparison: Chest radiograph 08/19/2018, thoracic spine radiographs
08/19/2018

CLINICAL DATA: Chronic persistent cough for more than 8 weeks,
weight loss, shortness of breath, smoker, history COPD, hypertension



[Series 2: chest 2.00 · axial · 0.66mm/px · z∈[-1255,-947]mm · 12 of 183 slices shown, 15 images]
[im 15/183  mediastinal]
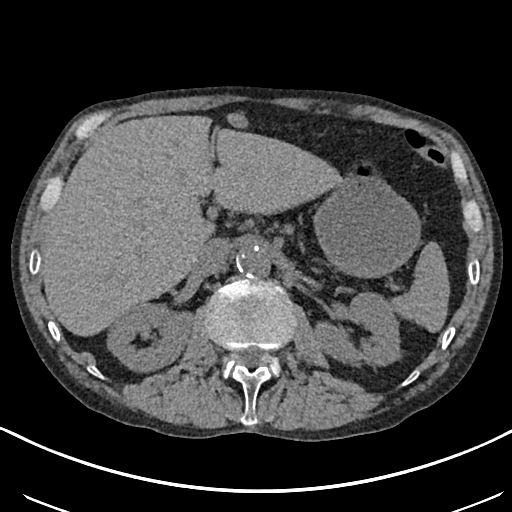
[im 15/183  lung]
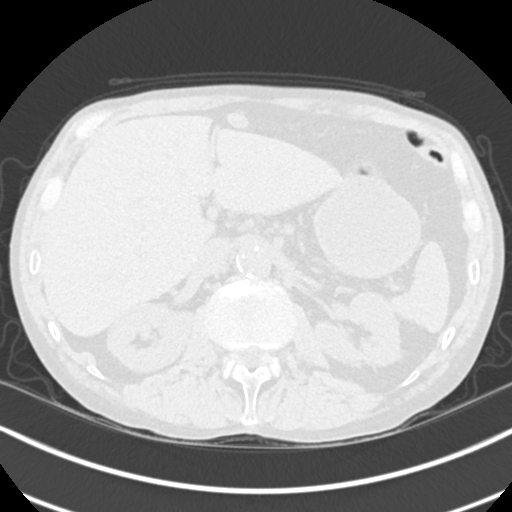
[im 29/183  lung]
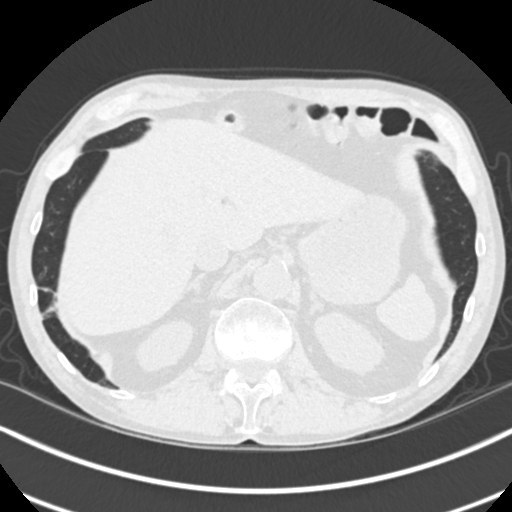
[im 43/183  lung]
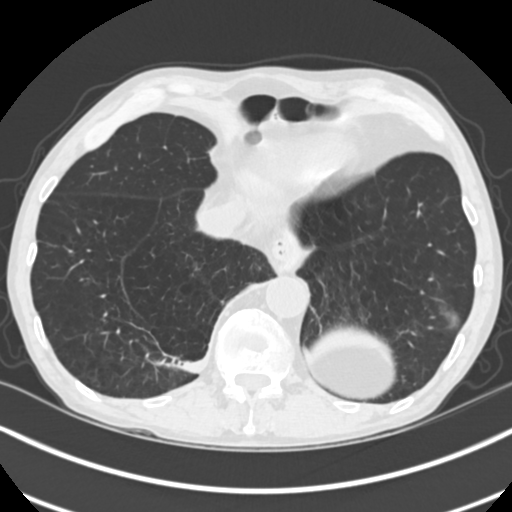
[im 57/183  lung]
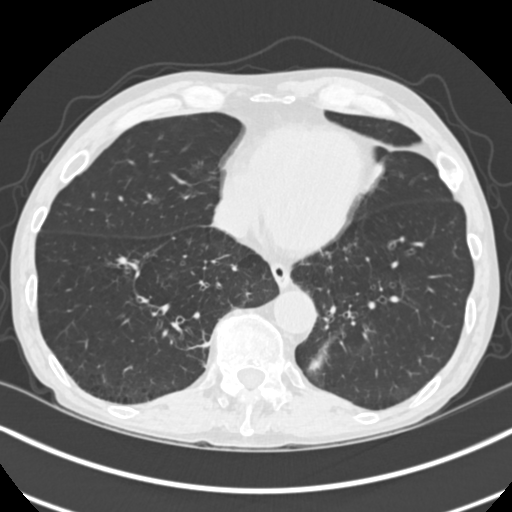
[im 71/183  mediastinal]
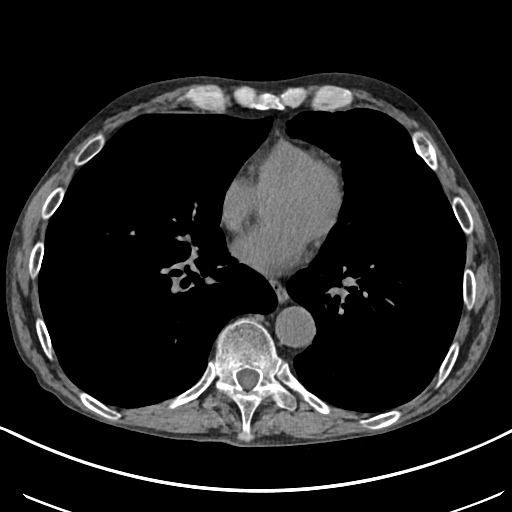
[im 71/183  lung]
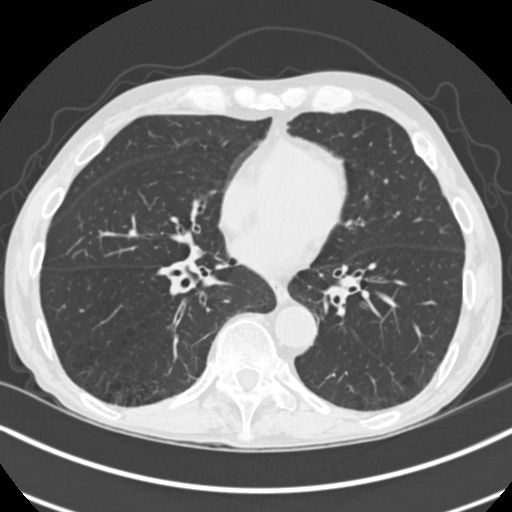
[im 85/183  lung]
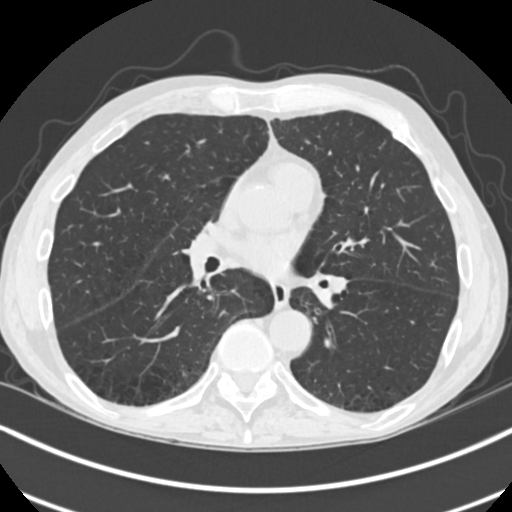
[im 99/183  lung]
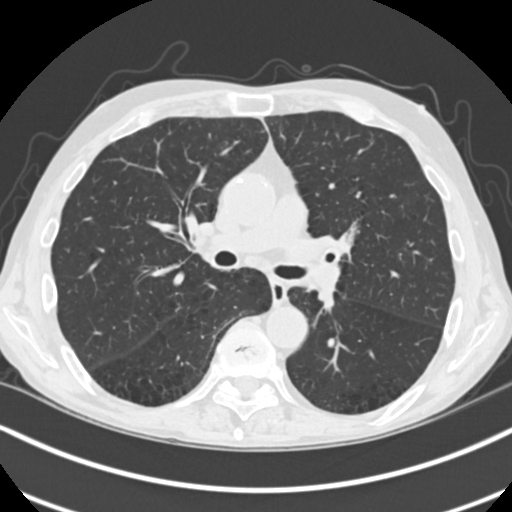
[im 113/183  lung]
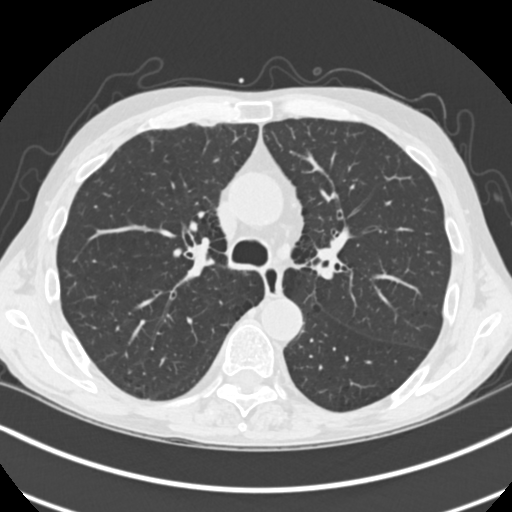
[im 127/183  mediastinal]
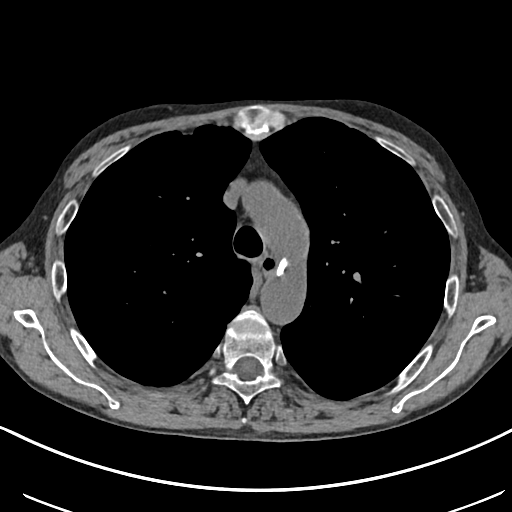
[im 127/183  lung]
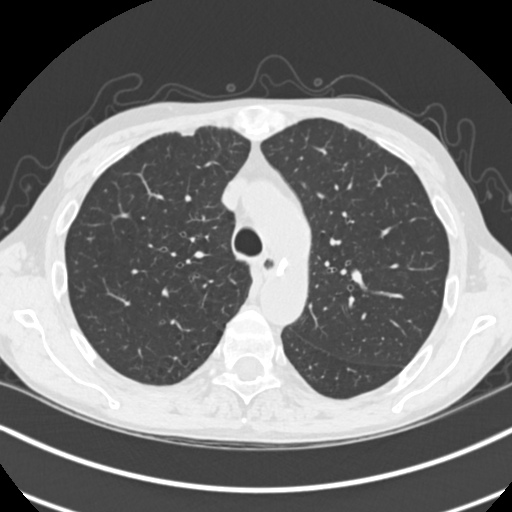
[im 141/183  lung]
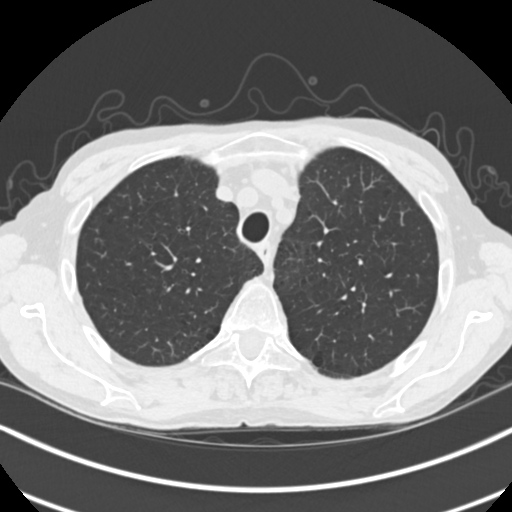
[im 155/183  lung]
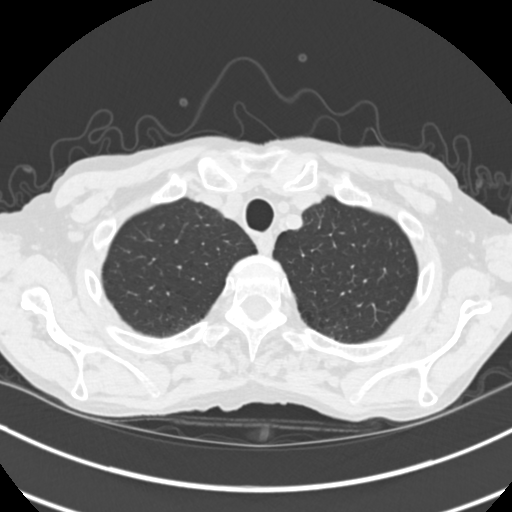
[im 169/183  lung]
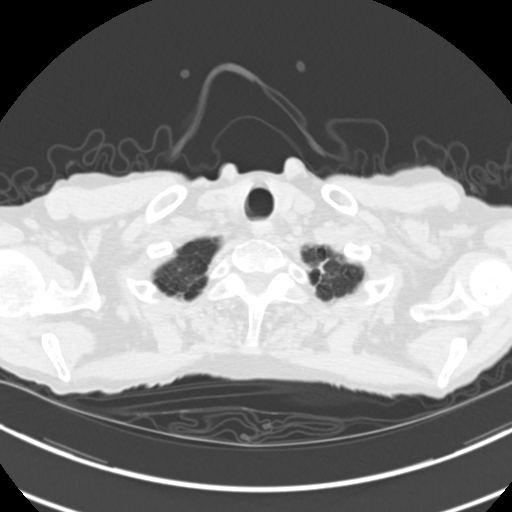

[Series 5: coronals chest 2.00 cor · coronal · 0.66mm/px · 3 of 129 slices shown]
[im 26/129  lung]
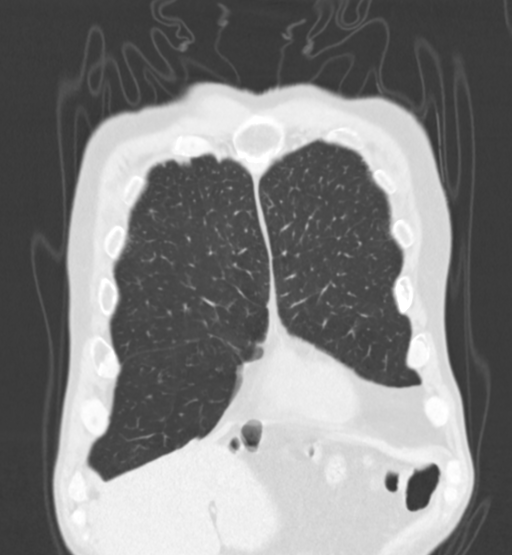
[im 52/129  lung]
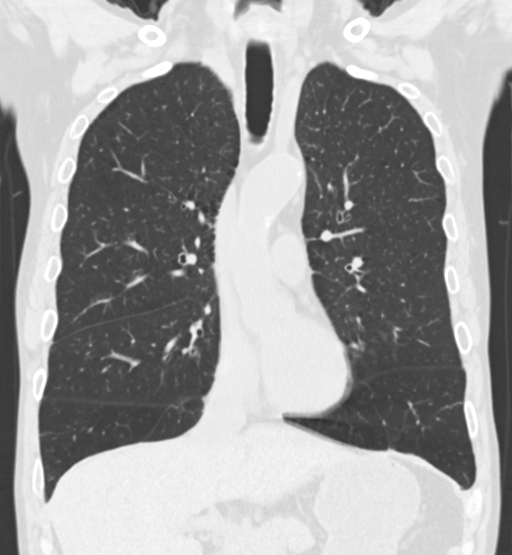
[im 77/129  lung]
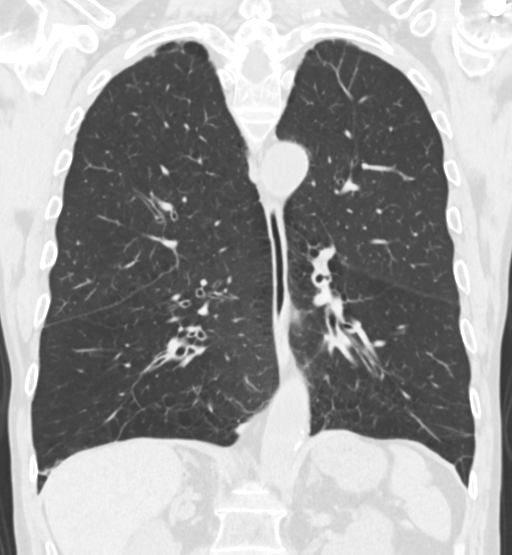

[15 of 36 positions shown; findings below may reference images not displayed]

FINDINGS: Cardiovascular: Atherosclerotic calcifications aortic arch and
minimally in coronary arteries. Aorta normal caliber. Heart size
normal. No pericardial effusion.

Mediastinum/Nodes: Esophagus normal appearance. Few scattered normal
size mediastinal lymph nodes. No thoracic adenopathy. Base of
cervical region normal appearance.

Lungs/Pleura: Diffuse emphysematous changes with minimal scarring
versus subsegmental atelectasis in BILATERAL lower lobes. Lungs
clear. No pulmonary infiltrate, pleural effusion, or pneumothorax.
No pulmonary mass or nodule.

Upper Abdomen: Visualized upper abdomen unremarkable

Musculoskeletal: Marked compression fractures of T8 and L1 unchanged
with mild superior endplate compression fracture of T5, new though
age indeterminate. Diffuse osseous demineralization. Old anterior
LEFT rib fractures.
IMPRESSION: Emphysematous changes with minimal atelectasis versus scarring at
lung bases.

No acute intrathoracic abnormalities.

Compression fractures of T5, T8, and L1.

Aortic Atherosclerosis ([id number].N) and Emphysema ([id number]).
